# Patient Record
Sex: Female | Born: 1935 | Race: White | Hispanic: No | Marital: Married | Smoking: Never smoker
Health system: Southern US, Community
[De-identification: ages and names within clinical notes are randomized; demographics above are authoritative.]

## PROBLEM LIST (undated history)

## (undated) DIAGNOSIS — R42 Dizziness and giddiness: Secondary | ICD-10-CM

## (undated) DIAGNOSIS — I10 Essential (primary) hypertension: Secondary | ICD-10-CM

## (undated) DIAGNOSIS — E079 Disorder of thyroid, unspecified: Secondary | ICD-10-CM

## (undated) HISTORY — PX: APPENDECTOMY: SHX54

## (undated) HISTORY — PX: HERNIA REPAIR: SHX51

## (undated) HISTORY — PX: ABDOMINAL HYSTERECTOMY: SHX81

---

## 1999-05-15 ENCOUNTER — Encounter: Admission: RE | Admit: 1999-05-15 | Discharge: 1999-05-15 | Payer: Self-pay | Admitting: Rheumatology

## 1999-05-15 ENCOUNTER — Encounter: Payer: Self-pay | Admitting: Rheumatology

## 2000-05-20 ENCOUNTER — Encounter: Admission: RE | Admit: 2000-05-20 | Discharge: 2000-05-20 | Payer: Self-pay | Admitting: Rheumatology

## 2000-05-20 ENCOUNTER — Encounter: Payer: Self-pay | Admitting: Rheumatology

## 2000-05-28 ENCOUNTER — Other Ambulatory Visit: Admission: RE | Admit: 2000-05-28 | Discharge: 2000-05-28 | Payer: Self-pay | Admitting: Rheumatology

## 2000-11-25 ENCOUNTER — Encounter: Admission: RE | Admit: 2000-11-25 | Discharge: 2000-11-25 | Payer: Self-pay | Admitting: Rheumatology

## 2000-11-25 ENCOUNTER — Encounter: Payer: Self-pay | Admitting: Rheumatology

## 2001-06-10 ENCOUNTER — Encounter: Admission: RE | Admit: 2001-06-10 | Discharge: 2001-06-10 | Payer: Self-pay | Admitting: Rheumatology

## 2001-06-10 ENCOUNTER — Encounter: Payer: Self-pay | Admitting: Rheumatology

## 2002-06-14 ENCOUNTER — Encounter: Admission: RE | Admit: 2002-06-14 | Discharge: 2002-06-14 | Payer: Self-pay | Admitting: Rheumatology

## 2002-06-14 ENCOUNTER — Encounter: Payer: Self-pay | Admitting: Rheumatology

## 2003-04-17 ENCOUNTER — Encounter: Admission: RE | Admit: 2003-04-17 | Discharge: 2003-04-17 | Payer: Self-pay | Admitting: Orthopedic Surgery

## 2003-05-03 ENCOUNTER — Encounter: Admission: RE | Admit: 2003-05-03 | Discharge: 2003-05-03 | Payer: Self-pay | Admitting: Orthopedic Surgery

## 2003-05-05 ENCOUNTER — Ambulatory Visit (HOSPITAL_BASED_OUTPATIENT_CLINIC_OR_DEPARTMENT_OTHER): Admission: RE | Admit: 2003-05-05 | Discharge: 2003-05-05 | Payer: Self-pay | Admitting: Orthopedic Surgery

## 2003-05-05 ENCOUNTER — Ambulatory Visit (HOSPITAL_COMMUNITY): Admission: RE | Admit: 2003-05-05 | Discharge: 2003-05-05 | Payer: Self-pay | Admitting: Orthopedic Surgery

## 2004-02-13 ENCOUNTER — Encounter: Admission: RE | Admit: 2004-02-13 | Discharge: 2004-02-13 | Payer: Self-pay | Admitting: Rheumatology

## 2005-02-25 ENCOUNTER — Encounter: Admission: RE | Admit: 2005-02-25 | Discharge: 2005-02-25 | Payer: Self-pay | Admitting: Rheumatology

## 2006-03-11 ENCOUNTER — Encounter: Admission: RE | Admit: 2006-03-11 | Discharge: 2006-03-11 | Payer: Self-pay | Admitting: Rheumatology

## 2007-03-25 ENCOUNTER — Encounter: Admission: RE | Admit: 2007-03-25 | Discharge: 2007-03-25 | Payer: Self-pay | Admitting: Rheumatology

## 2008-03-28 ENCOUNTER — Encounter: Admission: RE | Admit: 2008-03-28 | Discharge: 2008-03-28 | Payer: Self-pay | Admitting: Rheumatology

## 2009-03-29 ENCOUNTER — Encounter: Admission: RE | Admit: 2009-03-29 | Discharge: 2009-03-29 | Payer: Self-pay | Admitting: Internal Medicine

## 2009-04-28 ENCOUNTER — Encounter: Payer: Self-pay | Admitting: Emergency Medicine

## 2009-04-28 ENCOUNTER — Ambulatory Visit: Payer: Self-pay | Admitting: Diagnostic Radiology

## 2009-04-28 ENCOUNTER — Inpatient Hospital Stay (HOSPITAL_COMMUNITY): Admission: EM | Admit: 2009-04-28 | Discharge: 2009-04-29 | Payer: Self-pay | Admitting: Internal Medicine

## 2010-05-12 LAB — HEMOGLOBIN A1C
Hgb A1c MFr Bld: 5.6 % (ref 4.6–6.1)
Mean Plasma Glucose: 114 mg/dL

## 2010-05-12 LAB — TSH: TSH: 1.166 u[IU]/mL (ref 0.350–4.500)

## 2010-05-12 LAB — BASIC METABOLIC PANEL
BUN: 16 mg/dL (ref 6–23)
Calcium: 8.2 mg/dL — ABNORMAL LOW (ref 8.4–10.5)
GFR calc non Af Amer: 38 mL/min — ABNORMAL LOW (ref >60)
Glucose, Bld: 98 mg/dL (ref 70–99)
Sodium: 140 mEq/L (ref 135–145)

## 2010-05-13 LAB — DIFFERENTIAL
Basophils Relative: 1 % (ref 0–1)
Eosinophils Relative: 3 % (ref 0–5)

## 2010-05-13 LAB — CBC
HCT: 41 % (ref 36.0–46.0)
Hemoglobin: 13.9 g/dL (ref 12.0–15.0)
MCV: 95.6 fL (ref 78.0–100.0)
RDW: 11.9 % (ref 11.5–15.5)

## 2010-05-13 LAB — BASIC METABOLIC PANEL
Calcium: 9.1 mg/dL (ref 8.4–10.5)
GFR calc Af Amer: >60 mL/min (ref >60)
GFR calc non Af Amer: 52 mL/min — ABNORMAL LOW (ref >60)
Sodium: 144 mEq/L (ref 135–145)

## 2010-05-13 LAB — GASTRIC OCCULT BLOOD (1-CARD TO LAB): Occult Blood, Gastric: POSITIVE — AB

## 2010-07-05 NOTE — Op Note (Signed)
[name], Candace Perkins                          ACCOUNT NO.:  [id number]   MEDICAL RECORD NO.:  [id number]                   PATIENT TYPE:  AMB   LOCATION:  DSC                                  FACILITY:  [facility]   PHYSICIAN:  Thera Flake., M.D.             DATE OF BIRTH:  29-Jul-1920   DATE OF PROCEDURE:  05/05/2003  DATE OF DISCHARGE:                                 OPERATIVE REPORT   PREOPERATIVE DIAGNOSIS:  1. Rotator cuff tear, right shoulder.  2. Partial biceps tendon tear.  3. Degenerative tear anterior and superior labrum.  4. Impingement acromioclavicular joint arthritis.   POSTOPERATIVE DIAGNOSIS:  1. Rotator cuff tear, right shoulder.  2. Partial biceps tendon tear.  3. Degenerative tear anterior and superior labrum.  4. Impingement acromioclavicular joint arthritis.   OPERATION PERFORMED:  1. Arthroscopic debridement torn labrum, biceps tendon.  2. Open acromioplasty, distal clavicle excision.  3. Open rotator cuff repair.   SURGEON:  Dyke Brackett, M.D.   ASSISTANT:  Madilyn Fireman, P.A.-C.   ANESTHESIA:  General with block.   INDICATIONS FOR PROCEDURE:  The patient is an 77 year old with MRI proven  cuff tear with __________  felt to be amenable to overnight hospitalization  repair.   DESCRIPTION OF PROCEDURE:  She was arthroscoped in beach chair position  after general anesthesia and a block was performed by Leonette Most E. Gelene Mink,  M.D.  Immediately obvious was a very large tear with significant retraction.  The patient is a relatively active 77 year old.  It is her dominant arm.  It  was thought that attempted repair was indicated.  It was a very large tear.  There was degenerative tearing in the anterior and superior labrum and  atrophic tearing and partial tear of the biceps requiring debridement.  This  was followed by conversion of the procedure to an open procedure with  freshening of the edges.  This created a rather large defect in the shoulder  relative to atrophic attenuated cuff.  A portion of the anterior posterior  aspects of the cuff were converted more to a transverse tear with oversewing  of the longitudinal component with #2 FiberWire.  Prior to this a bone being  burred with a high speed bur on the tuberosity as well as excision of  extremely hypertrophied arthritic acromioclavicular joint as well as a very  prominent type 3 acromion.  Again, once the longitudinal component was  fixed, one Arthrex anchor was placed to anchor the repair in the transverse  component and a good repair was obtained although the attachment could not  be placed back into the anatomic attachment due to tissue loss.  The wound  was copiously  irrigated and closed with layers, particularly with  #1 Tycron on the  deltoid, 2-0 Vicryl in the subcutaneous tissues and the skin with Monocryl.  Marcaine with epinephrine infiltrated in the skin.  Lightly compressive  sterile  dressing applied.                                               Thera Flake., M.D.    WDC/MEDQ  D:  05/05/2003  T:  05/08/2003  Job:  [id number]

## 2012-05-13 ENCOUNTER — Encounter (HOSPITAL_COMMUNITY): Payer: Self-pay | Admitting: Emergency Medicine

## 2012-05-13 ENCOUNTER — Emergency Department (HOSPITAL_COMMUNITY): Payer: Medicare Other

## 2012-05-13 ENCOUNTER — Inpatient Hospital Stay (HOSPITAL_COMMUNITY)
Admission: EM | Admit: 2012-05-13 | Discharge: 2012-05-15 | DRG: 065 | Disposition: A | Discharge status: Home Health | Payer: Medicare Other | Attending: Internal Medicine | Admitting: Internal Medicine

## 2012-05-13 DIAGNOSIS — R29810 Facial weakness: Secondary | ICD-10-CM | POA: Diagnosis present

## 2012-05-13 DIAGNOSIS — I10 Essential (primary) hypertension: Secondary | ICD-10-CM | POA: Diagnosis present

## 2012-05-13 DIAGNOSIS — I639 Cerebral infarction, unspecified: Secondary | ICD-10-CM | POA: Diagnosis present

## 2012-05-13 DIAGNOSIS — R471 Dysarthria and anarthria: Secondary | ICD-10-CM | POA: Diagnosis present

## 2012-05-13 DIAGNOSIS — I6330 Cerebral infarction due to thrombosis of unspecified cerebral artery: Secondary | ICD-10-CM

## 2012-05-13 DIAGNOSIS — I6350 Cerebral infarction due to unspecified occlusion or stenosis of unspecified cerebral artery: Secondary | ICD-10-CM | POA: Diagnosis present

## 2012-05-13 DIAGNOSIS — R4789 Other speech disturbances: Secondary | ICD-10-CM | POA: Diagnosis present

## 2012-05-13 DIAGNOSIS — R062 Wheezing: Secondary | ICD-10-CM | POA: Diagnosis not present

## 2012-05-13 DIAGNOSIS — E039 Hypothyroidism, unspecified: Secondary | ICD-10-CM | POA: Diagnosis present

## 2012-05-13 DIAGNOSIS — G8190 Hemiplegia, unspecified affecting unspecified side: Secondary | ICD-10-CM | POA: Diagnosis present

## 2012-05-13 HISTORY — DX: Essential (primary) hypertension: I10

## 2012-05-13 HISTORY — DX: Disorder of thyroid, unspecified: E07.9

## 2012-05-13 HISTORY — DX: Dizziness and giddiness: R42

## 2012-05-13 LAB — CBC WITH DIFFERENTIAL/PLATELET
Basophils Absolute: 0 10*3/uL (ref 0.0–0.1)
Basophils Relative: 0 % (ref 0–1)
Eosinophils Absolute: 0.1 10*3/uL (ref 0.0–0.7)
Eosinophils Relative: 1 % (ref 0–5)
HCT: 40.8 % (ref 36.0–46.0)
Hemoglobin: 14.7 g/dL (ref 12.0–15.0)
Lymphocytes Relative: 14 % (ref 12–46)
Monocytes Absolute: 0.6 10*3/uL (ref 0.1–1.0)
Neutrophils Relative %: 79 % — ABNORMAL HIGH (ref 43–77)
Platelets: 182 10*3/uL (ref 150–400)
RBC: 4.52 MIL/uL (ref 3.87–5.11)

## 2012-05-13 LAB — URINALYSIS, ROUTINE W REFLEX MICROSCOPIC
Ketones, ur: NEGATIVE mg/dL
pH: 7.5 (ref 5.0–8.0)

## 2012-05-13 LAB — BASIC METABOLIC PANEL
BUN: 13 mg/dL (ref 6–23)
Chloride: 101 mEq/L (ref 96–112)
Creatinine, Ser: 0.92 mg/dL (ref 0.50–1.10)
GFR calc Af Amer: 62 mL/min — ABNORMAL LOW (ref >90)
Sodium: 142 mEq/L (ref 135–145)

## 2012-05-13 MED ORDER — SODIUM CHLORIDE 0.9 % IV BOLUS (SEPSIS)
1000.0000 mL | Freq: Once | INTRAVENOUS | Status: AC
  Administered 2012-05-13: 1000 mL via INTRAVENOUS

## 2012-05-13 MED ORDER — ONDANSETRON HCL 4 MG/2ML IJ SOLN: 4.0000 mg | Freq: Three times a day (TID) | INTRAMUSCULAR | Status: DC | PRN

## 2012-05-13 MED ORDER — ENOXAPARIN SODIUM 40 MG/0.4ML ~~LOC~~ SOLN
40.0000 mg | SUBCUTANEOUS | Status: DC
  Administered 2012-05-13 – 2012-05-14 (×2): 40 mg via SUBCUTANEOUS
  Filled 2012-05-13 (×3): qty 0.4

## 2012-05-13 MED ORDER — ASPIRIN EC 81 MG PO TBEC
81.0000 mg | DELAYED_RELEASE_TABLET | Freq: Every day | ORAL | Status: DC
  Filled 2012-05-13 (×2): qty 1

## 2012-05-13 MED ORDER — ACETAMINOPHEN 500 MG PO TABS
1000.0000 mg | ORAL_TABLET | Freq: Four times a day (QID) | ORAL | Status: DC | PRN
  Filled 2012-05-13: qty 2

## 2012-05-13 MED ORDER — LEVOTHYROXINE SODIUM 50 MCG PO TABS
50.0000 ug | ORAL_TABLET | Freq: Every day | ORAL | Status: DC
  Administered 2012-05-14 – 2012-05-15 (×2): 50 ug via ORAL
  Filled 2012-05-13 (×4): qty 1

## 2012-05-13 MED ORDER — METOPROLOL SUCCINATE ER 50 MG PO TB24
50.0000 mg | ORAL_TABLET | Freq: Every day | ORAL | Status: DC
  Administered 2012-05-14 – 2012-05-15 (×2): 50 mg via ORAL
  Filled 2012-05-13 (×3): qty 1

## 2012-05-13 MED ORDER — ONDANSETRON HCL 4 MG/2ML IJ SOLN
4.0000 mg | Freq: Once | INTRAMUSCULAR | Status: AC
  Administered 2012-05-13: 4 mg via INTRAVENOUS
  Filled 2012-05-13: qty 2

## 2012-05-13 MED ORDER — SIMVASTATIN 20 MG PO TABS
20.0000 mg | ORAL_TABLET | Freq: Every day | ORAL | Status: DC
  Administered 2012-05-14: 20 mg via ORAL
  Filled 2012-05-13 (×2): qty 1

## 2012-05-13 MED ORDER — ONDANSETRON HCL 4 MG/2ML IJ SOLN: 4.0000 mg | Freq: Four times a day (QID) | INTRAMUSCULAR | Status: DC | PRN

## 2012-05-13 MED ORDER — HYPROMELLOSE (GONIOSCOPIC) 2.5 % OP SOLN
1.0000 [drp] | Freq: Three times a day (TID) | OPHTHALMIC | Status: DC | PRN
  Filled 2012-05-13: qty 15

## 2012-05-13 MED ORDER — ADULT MULTIVITAMIN W/MINERALS CH
1.0000 | ORAL_TABLET | Freq: Every day | ORAL | Status: DC
  Administered 2012-05-14 – 2012-05-15 (×2): 1 via ORAL
  Filled 2012-05-13 (×2): qty 1

## 2012-05-13 NOTE — ED Notes (Signed)
Pt returned from vascular lab. Placed back on monitor.

## 2012-05-13 NOTE — ED Notes (Signed)
Pt here with c/o hypertension and "i feel lifeless". Reports she felt lightheaded this morning around 0930. Pt reports nausea with no vomiting. Family member in room reports pt being "unsteady" with gait.

## 2012-05-13 NOTE — Progress Notes (Signed)
  Echocardiogram 2D Echocardiogram has been performed.  Candace Perkins 05/13/2012, 5:42 PM

## 2012-05-13 NOTE — ED Provider Notes (Signed)
History     CSN: [id number]  Arrival date & time 05/13/12  1003   First MD Initiated Contact with Patient 05/13/12 1027      Chief Complaint  Patient presents with  . Hypertension    (Consider location/radiation/quality/duration/timing/severity/associated sxs/prior treatment) HPI Comments: Pt is a 79 y/o female with chief complaint of feeling light headed. She states that she was standing and getting dressed this morning when she began feeling light headed and weak, but did not pass out or fall. She took her blood pressure at that point and it was 170's systolic, which is higher than usual (normally 130's-140's). She also endorses feeling off balance at that time.She has not had trouble speaking, extremity weakness, numbness or facial droop. Pt has a history of vertigo, but says that this current episode does not feel like the same dizziness, and that this has never happened before. The light headedness has been constant, but is worse when sitting or standing from a lying position. She endorses nausea, but denies vomiting, diarrhea, abdominal pain, vaginal bleeding, cough, shortness of breath, chest pain.  No hx of strokes.  Symptoms started at 9 pm.  Patient is a 79 y.o. female presenting with hypertension. The history is provided by the patient.  Hypertension Pertinent negatives include no chest pain, no abdominal pain, no headaches and no shortness of breath.    Past Medical History  Diagnosis Date  . Hypertension   . Thyroid disease   . Vertigo     No past surgical history on file.  No family history on file.  History  Substance Use Topics  . Smoking status: Not on file  . Smokeless tobacco: Not on file  . Alcohol Use: Not on file    OB History   Grav Para Term Preterm Abortions TAB SAB Ect Mult Living                  Review of Systems  Constitutional: Positive for fatigue. Negative for activity change.  HENT: Negative for facial swelling and neck pain.    Respiratory: Negative for cough, shortness of breath and wheezing.   Cardiovascular: Negative for chest pain.  Gastrointestinal: Positive for nausea. Negative for vomiting, abdominal pain, diarrhea, constipation, blood in stool and abdominal distention.  Genitourinary: Negative for hematuria and difficulty urinating.  Skin: Negative for color change and rash.  Neurological: Positive for light-headedness. Negative for syncope, facial asymmetry, speech difficulty and headaches.  Hematological: Does not bruise/bleed easily.  Psychiatric/Behavioral: Negative for confusion.    Allergies  Demerol and Penicillins  Home Medications   Current Outpatient Rx  Name  Route  Sig  Dispense  Refill  . acetaminophen (TYLENOL) 500 MG tablet   Oral   Take 1,000 mg by mouth every 6 (six) hours as needed for pain.         . hydrochlorothiazide (MICROZIDE) 12.5 MG capsule   Oral   Take 12.5 mg by mouth daily.         . hydroxypropyl methylcellulose (ISOPTO TEARS) 2.5 % ophthalmic solution   Both Eyes   Place 1 drop into both eyes 3 (three) times daily as needed (for dry eyes).         Marland Kitchen levothyroxine (SYNTHROID, LEVOTHROID) 50 MCG tablet   Oral   Take 50 mcg by mouth daily.         . meclizine (ANTIVERT) 12.5 MG tablet   Oral   Take 12.5 mg by mouth 3 (three) times daily as needed  for dizziness.         . metoprolol succinate (TOPROL-XL) 50 MG 24 hr tablet   Oral   Take 50 mg by mouth daily. Take with or immediately following a meal.         . Multiple Vitamin (MULTIVITAMIN WITH MINERALS) TABS   Oral   Take 1 tablet by mouth daily.         . pravastatin (PRAVACHOL) 40 MG tablet   Oral   Take 40 mg by mouth daily.           BP 170/76  Pulse 89  Temp(Src) 97.4 F (36.3 C) (Oral)  SpO2 93%  Physical Exam  Constitutional: She is oriented to person, place, and time. She appears well-developed.  HENT:  Head: Normocephalic and atraumatic.  Eyes: Conjunctivae and EOM  are normal. Pupils are equal, round, and reactive to light.  Neck: Normal range of motion. Neck supple.  Cardiovascular: Normal rate and regular rhythm.   Murmur heard. Pulmonary/Chest: Effort normal and breath sounds normal. No respiratory distress.  Abdominal: Soft. Bowel sounds are normal. She exhibits no distension. There is no tenderness. There is no rebound and no guarding.  Neurological: She is alert and oriented to person, place, and time. No cranial nerve deficit. Coordination normal.  Cerebellar exam is normal (finger to nose and heel to shin) Sensory exam normal for bilateral upper and lower extremities - and patient is able to discriminate between sharp and dull. Motor exam is 4+/5  No nystagmus seen, but patient states that her dizziness gets worse when she sits up  Skin: Skin is warm and dry.    ED Course  Procedures (including critical care time)  Labs Reviewed  CBC WITH DIFFERENTIAL  BASIC METABOLIC PANEL  URINALYSIS, ROUTINE W REFLEX MICROSCOPIC   Dg Chest Port 1 View  05/13/2012  *RADIOLOGY REPORT*  Clinical Data: Hypertension.  PORTABLE CHEST - 1 VIEW  Comparison: PA and lateral chest 02/28/2010.  Findings: Heart size is upper normal.  Lungs clear.  No pneumothorax or pleural effusion.  IMPRESSION: No acute disease.   Original Report Authenticated By: Holley Dexter, M.D.      No diagnosis found.    MDM   Date: 05/13/2012  Rate: 73  Rhythm: normal sinus rhythm  QRS Axis: normal  Intervals: normal  ST/T Wave abnormalities: normal  Conduction Disutrbances: none  Narrative Interpretation: unremarkable  Pt comes in with cc of dizziness. Onset at 9 am. Neuro exam non focal, orthostatic negative. Pt has no bruits.  We had concerns for possible cerebellar stroke - so Neuro was consulted. 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 Repeat exam is unchanged Pt to be admitted.  Derwood Kaplan,  MD 05/13/12 1710

## 2012-05-13 NOTE — ED Notes (Signed)
Pt currently not on the floor and still in MRI at this time. Report received from [place], [place]

## 2012-05-13 NOTE — Progress Notes (Signed)
*  PRELIMINARY RESULTS* Vascular Ultrasound Carotid Duplex (Doppler) has been completed.  Preliminary findings: Bilateral:  No evidence of hemodynamically significant internal carotid artery stenosis.   Vertebral artery flow is antegrade.       Candace Perkins 05/13/2012, 5:42 PM

## 2012-05-13 NOTE — Consult Note (Addendum)
NEURO HOSPITALIST CONSULT NOTE    Reason for Consult:dizziness.  HPI:                                                                                                                                          Candace Perkins is an 79 y.o. female with a past medical history significant for hypertension, thyroid disease, episodic vertigo, brought to [facility] with sudden onset dizziness. She tells me that she has a history of chronic " spinning vertigo, with the last attack couple of years ago", but this morning around 9:30 am she developed abrupt onset of " a different type of dizziness" that has been present ever since. She said that it is not a fainting sensation or lightheadedness and the dizziness is not provoked by head movements. Stated that she feels nauseated but denies vomiting, tinnitus, headache, double vision, difficulty swallowing, dysequilibrium, focal weakness or numbness, slurred speech, language or vision impairment. Denies recent fever, infection, or head trauma. No new medications or changes in medications Blood pressure 160 mm HG in the ED. CT brain without contrast showed no acute intracranial abnormality.   Past Medical History  Diagnosis Date  . Hypertension   . Thyroid disease   . Vertigo     No past surgical history on file.  No family history on file.   Social History:  has no tobacco, alcohol, and drug history on file.  Allergies  Allergen Reactions  . Demerol (Meperidine)   . Penicillins     MEDICATIONS:                                                                                                                     I have reviewed the patient's current medications.   ROS:  History obtained from the patient and chart review.  General ROS: negative for - chills, fatigue, fever, night sweats, weight gain or  weight loss Psychological ROS: negative for - behavioral disorder, hallucinations, memory difficulties, mood swings or suicidal ideation Ophthalmic ROS: negative for - blurry vision, double vision, eye pain or loss of vision ENT ROS: negative for - epistaxis, nasal discharge, oral lesions, sore throat, tinnitus or vertigo Allergy and Immunology ROS: negative for - hives or itchy/watery eyes Hematological and Lymphatic ROS: negative for - bleeding problems, bruising or swollen lymph nodes Endocrine ROS: negative for - galactorrhea, hair pattern changes, polydipsia/polyuria or temperature intolerance Respiratory ROS: negative for - cough, hemoptysis, shortness of breath or wheezing Cardiovascular ROS: negative for - chest pain, dyspnea on exertion, edema or irregular heartbeat Gastrointestinal ROS: negative for - abdominal pain, diarrhea, hematemesis, nausea/vomiting or stool incontinence Genito-Urinary ROS: negative for - dysuria, hematuria, incontinence or urinary frequency/urgency Musculoskeletal ROS: negative for - joint swelling or muscular weakness Neurological ROS: as noted in HPI Dermatological ROS: negative for rash and skin lesion changes     Physical exam: pleasant female in no apparent distress. Blood pressure 170/76, pulse 89, temperature 97.4 F (36.3 C), temperature source Oral, SpO2 93.00%. Head: normocephalic. Neck: supple, no bruits, no JVD. Cardiac: no murmurs. Lungs: clear. Abdomen: soft, no tender, no mass. Extremities: no edema.   Neurologic Examination:                                                                                                      Mental Status: Alert, awake, oriented x 4, thought content appropriate. Comprehension, naming, and repetition intact. Speech fluent without evidence of aphasia.  Able to follow 3 step commands without difficulty. Cranial Nerves: II: Discs flat bilaterally; Visual fields grossly normal, pupils equal, round, reactive  to light and accommodation III,IV, VI: ptosis not present, extra-ocular motions intact bilaterally V,VII: smile symmetric, facial light touch sensation normal bilaterally VIII: hearing normal bilaterally IX,X: gag reflex present XI: bilateral shoulder shrug XII: midline tongue extension Motor: Right : Upper extremity   5/5    Left:     Upper extremity   5/5  Lower extremity   5/5     Lower extremity   5/5 Tone and bulk:normal tone throughout; no atrophy noted Sensory: Pinprick and light touch intact throughout, bilaterally Deep Tendon Reflexes: 2+ and symmetric throughout Plantars: Right: downgoing   Left: downgoing Cerebellar: normal finger-to-nose,  normal heel-to-shin test Gait: no ataxia. CV: pulses palpable throughout    No results found for this basename: cbc, bmp, coags, chol, tri, ldl, hga1c    Results for orders placed during the hospital encounter of 05/13/12 (from the past 48 hour(s))  CBC WITH DIFFERENTIAL     Status: Abnormal   Collection Time    05/13/12 11:06 AM      Result Value Range   WBC 9.9  4.0 - 10.5 K/uL   RBC 4.52  3.87 - 5.11 MIL/uL   Hemoglobin 14.7  12.0 - 15.0 g/dL   HCT 16.1  09.6 - 04.5 %  MCV 90.3  78.0 - 100.0 fL   MCH 32.5  26.0 - 34.0 pg   MCHC 36.0  30.0 - 36.0 g/dL   RDW 57.8  46.9 - 62.9 %   Platelets 182  150 - 400 K/uL   Neutrophils Relative 79 (*) 43 - 77 %   Neutro Abs 7.8 (*) 1.7 - 7.7 K/uL   Lymphocytes Relative 14  12 - 46 %   Lymphs Abs 1.4  0.7 - 4.0 K/uL   Monocytes Relative 6  3 - 12 %   Monocytes Absolute 0.6  0.1 - 1.0 K/uL   Eosinophils Relative 1  0 - 5 %   Eosinophils Absolute 0.1  0.0 - 0.7 K/uL   Basophils Relative 0  0 - 1 %   Basophils Absolute 0.0  0.0 - 0.1 K/uL  BASIC METABOLIC PANEL     Status: Abnormal   Collection Time    05/13/12 11:06 AM      Result Value Range   Sodium 142  135 - 145 mEq/L   Potassium 3.6  3.5 - 5.1 mEq/L   Chloride 101  96 - 112 mEq/L   CO2 30  19 - 32 mEq/L   Glucose, Bld 119  (*) 70 - 99 mg/dL   BUN 13  6 - 23 mg/dL   Creatinine, Ser 5.28  0.50 - 1.10 mg/dL   Calcium 9.9  8.4 - 41.3 mg/dL   GFR calc non Af Amer 53 (*) >90 mL/min   GFR calc Af Amer 62 (*) >90 mL/min   Comment:            The eGFR has been calculated     using the CKD EPI equation.     This calculation has not been     validated in all clinical     situations.     eGFR's persistently     <90 mL/min signify     possible Chronic Kidney Disease.    Ct Head Wo Contrast  05/13/2012  *RADIOLOGY REPORT*  Clinical Data: Dizziness, nausea and vomiting.  CT HEAD WITHOUT CONTRAST  Technique:  Contiguous axial images were obtained from the base of the skull through the vertex without contrast.  Comparison: Head CT scan 04/28/2009.  Findings: There is no evidence of acute intracranial abnormality including infarct, hemorrhage, mass lesion, mass effect or abnormal extra-axial fluid collection.  Mild atrophy and chronic microvascular ischemic change are noted.  The calvarium is intact.  IMPRESSION: No acute abnormality.   Original Report Authenticated By: Holley Dexter, M.D.    Dg Chest Port 1 View  05/13/2012  *RADIOLOGY REPORT*  Clinical Data: Hypertension.  PORTABLE CHEST - 1 VIEW  Comparison: PA and lateral chest 02/28/2010.  Findings: Heart size is upper normal.  Lungs clear.  No pneumothorax or pleural effusion.  IMPRESSION: No acute disease.   Original Report Authenticated By: Holley Dexter, M.D.      Assessment/Plan: 79 years old female with hypertension, thyroid disease, episodic vertigo, brought to the ED with acute onset dizziness, nausea, and elevated BP. She describes the dizziness as different to her episodic vertigo. Neuro-exam is unimpressive and CT brain unremarkable. Will suggest MRI brain for further clarification of her symptoms  Wyatt Portela, MD [facility] [phone]  05/13/2012, 12:59 PM   Addendum: MRI-DWI showed acute infarct in the deep white matter on the  right and there may also be some involvement of the posterior limb internal capsule and lateral thalamus on the right. MRA  brain unimpressive. Start aspirin 81 mg daily. Complete stroke work up. Stroke team to resume care in the morning.  Wyatt Portela, MD

## 2012-05-13 NOTE — H&P (Signed)
[facility] History and Physical  Candace Perkins UXL:[id number] DOB: 05-02-1920 DOA: 05/13/2012  Referring physician: ED PCP: Lillia Mountain, MD  Specialists: neurology  Chief Complaint:  Chief Complaint  Patient presents with  . Hypertension  dizzy    HPI: Candace Perkins is a 79 y.o. female with hx of HTn who had sudden onset of dizziness today. She also noted some nausea, but did not vomit. No headaches. No vertigo. No focal weakness . She was seen by neurology but she did not have any major neurological deficits and did not receive t PA. MRI was positive for a small deep white matter stroke and patient is getting admitted to the hospital.     Review of Systems: The patient denies anorexia, fever, weight loss,, vision loss, decreased hearing, hoarseness, chest pain, syncope, dyspnea on exertion, peripheral edema, balance deficits, hemoptysis, abdominal pain, melena, hematochezia, severe indigestion/heartburn, hematuria, incontinence, genital sores, muscle weakness, suspicious skin lesions, transient blindness, difficulty walking, depression, unusual weight change, abnormal bleeding, enlarged lymph nodes, angioedema, and breast masses.    Past Medical History  Diagnosis Date  . Hypertension   . Thyroid disease   . Vertigo    No past surgical history on file. Social History:  reports that she has never smoked. She does not have any smokeless tobacco history on file. She reports that she does not drink alcohol or use illicit drugs. Lives with husband and is independent   Allergies  Allergen Reactions  . Demerol (Meperidine)   . Penicillins     Fhx  Negative for strokes   Prior to Admission medications   Medication Sig Start Date End Date Taking? Authorizing Provider  acetaminophen (TYLENOL) 500 MG tablet Take 1,000 mg by mouth every 6 (six) hours as needed for pain.   Yes Historical Provider, MD  hydrochlorothiazide (MICROZIDE) 12.5 MG capsule Take 12.5 mg by  mouth daily.   Yes Historical Provider, MD  hydroxypropyl methylcellulose (ISOPTO TEARS) 2.5 % ophthalmic solution Place 1 drop into both eyes 3 (three) times daily as needed (for dry eyes).   Yes Historical Provider, MD  levothyroxine (SYNTHROID, LEVOTHROID) 50 MCG tablet Take 50 mcg by mouth daily.   Yes Historical Provider, MD  meclizine (ANTIVERT) 12.5 MG tablet Take 12.5 mg by mouth 3 (three) times daily as needed for dizziness.   Yes Historical Provider, MD  metoprolol succinate (TOPROL-XL) 50 MG 24 hr tablet Take 50 mg by mouth daily. Take with or immediately following a meal.   Yes Historical Provider, MD  Multiple Vitamin (MULTIVITAMIN WITH MINERALS) TABS Take 1 tablet by mouth daily.   Yes Historical Provider, MD  pravastatin (PRAVACHOL) 40 MG tablet Take 40 mg by mouth daily.   Yes Historical Provider, MD   Physical Exam: Filed Vitals:   05/13/12 1020 05/13/12 1203 05/13/12 1204  BP: 168/75 156/67 170/76  Pulse: 79 80 89  Temp: 97.4 F (36.3 C)    TempSrc: Oral    SpO2: 93%     Patient Vitals for the past 24 hrs:  BP Temp Temp src Pulse SpO2  05/13/12 1204 170/76 mmHg - - 89 -  05/13/12 1203 156/67 mmHg - - 80 -  05/13/12 1020 168/75 mmHg 97.4 F (36.3 C) Oral 79 93 %     General:  axox3  Eyes: perrla , eomi   ENT: clear pharynx   Neck: No JVD  Cardiovascular: Regular rate and rhythm without murmurs rubs or gallops  Respiratory: Clear to auscultation bilateral  Abdomen:  Soft nontender bowel sounds present  Skin: No suspicious rashes  Musculoskeletal: Intact muscle bulk and tone  Psychiatric: Euthymic  Neurologic: Dysarthric, finger to nose abnormal on the left otherwise completely intact  Labs on Admission:  Basic Metabolic Panel:  Recent Labs Lab 05/13/12 1106  NA 142  K 3.6  CL 101  CO2 30  GLUCOSE 119*  BUN 13  CREATININE 0.92  CALCIUM 9.9   Liver Function Tests: No results found for this basename: AST, ALT, ALKPHOS, BILITOT, PROT,  ALBUMIN,  in the last 168 hours No results found for this basename: LIPASE, AMYLASE,  in the last 168 hours No results found for this basename: AMMONIA,  in the last 168 hours CBC:  Recent Labs Lab 05/13/12 1106  WBC 9.9  NEUTROABS 7.8*  HGB 14.7  HCT 40.8  MCV 90.3  PLT 182   Cardiac Enzymes: No results found for this basename: CKTOTAL, CKMB, CKMBINDEX, TROPONINI,  in the last 168 hours  BNP (last 3 results) No results found for this basename: PROBNP,  in the last 8760 hours CBG: No results found for this basename: GLUCAP,  in the last 168 hours  Radiological Exams on Admission: Ct Head Wo Contrast  05/13/2012  *RADIOLOGY REPORT*  Clinical Data: Dizziness, nausea and vomiting.  CT HEAD WITHOUT CONTRAST  Technique:  Contiguous axial images were obtained from the base of the skull through the vertex without contrast.  Comparison: Head CT scan 04/28/2009.  Findings: There is no evidence of acute intracranial abnormality including infarct, hemorrhage, mass lesion, mass effect or abnormal extra-axial fluid collection.  Mild atrophy and chronic microvascular ischemic change are noted.  The calvarium is intact.  IMPRESSION: No acute abnormality.   Original Report Authenticated By: Holley Dexter, M.D.    Mr [facility] Wo Contrast  05/13/2012  *RADIOLOGY REPORT*  Clinical Data:  Stroke.  Dizziness.  MRI HEAD WITHOUT CONTRAST MRA HEAD WITHOUT CONTRAST  Technique:  Multiplanar, multiecho pulse sequences of the brain and surrounding structures were obtained without intravenous contrast. Angiographic images of the head were obtained using MRA technique without contrast.  Comparison:  CT 05/13/2012  MRI HEAD  Findings:  Acute infarct in the deep white matter on the right. There may also be some involvement of the posterior limb internal capsule and lateral thalamus on the right.  No other acute infarct.  Generalized atrophy.  Chronic microvascular ischemic changes in the white matter.  Small chronic  infarcts in the cerebellum bilaterally.  Mild chronic microvascular ischemia in the pons.  Negative for intracranial hemorrhage.  Prominent calcification in the basal ganglia bilaterally, felt to be physiologic.  Ventricle size is normal.  Negative for mass lesion.  No midline shift.  IMPRESSION: Acute infarct in the deep white matter on the right.  Mild chronic microvascular ischemic changes in the white matter and pons.  MRA HEAD  Findings:  Both vertebral arteries are widely patent to the basilar.  PICA is patent bilaterally.  Basilar is widely patent. Superior cerebellar and posterior cerebral arteries are patent bilaterally.  Fetal origin of the right posterior cerebral artery with hypoplastic right P1 segment.  Cavernous carotid is widely patent bilaterally.  Anterior and middle cerebral arteries are patent bilaterally. Mild stenosis right middle cerebral artery bifurcation.  Mild irregularity in the left M1 segment.  Both anterior cerebral arteries are widely patent.  No aneurysm is detected.  IMPRESSION: Mild atherosclerotic irregularity in the middle cerebral artery bilaterally.  No large vessel occlusion or critical stenosis.  Original Report Authenticated By: Janeece Riggers, M.D.    Mr Brain Wo Contrast  05/13/2012  *RADIOLOGY REPORT*  Clinical Data:  Stroke.  Dizziness.  MRI HEAD WITHOUT CONTRAST MRA HEAD WITHOUT CONTRAST  Technique:  Multiplanar, multiecho pulse sequences of the brain and surrounding structures were obtained without intravenous contrast. Angiographic images of the head were obtained using MRA technique without contrast.  Comparison:  CT 05/13/2012  MRI HEAD  Findings:  Acute infarct in the deep white matter on the right. There may also be some involvement of the posterior limb internal capsule and lateral thalamus on the right.  No other acute infarct.  Generalized atrophy.  Chronic microvascular ischemic changes in the white matter.  Small chronic infarcts in the cerebellum  bilaterally.  Mild chronic microvascular ischemia in the pons.  Negative for intracranial hemorrhage.  Prominent calcification in the basal ganglia bilaterally, felt to be physiologic.  Ventricle size is normal.  Negative for mass lesion.  No midline shift.  IMPRESSION: Acute infarct in the deep white matter on the right.  Mild chronic microvascular ischemic changes in the white matter and pons.  MRA HEAD  Findings:  Both vertebral arteries are widely patent to the basilar.  PICA is patent bilaterally.  Basilar is widely patent. Superior cerebellar and posterior cerebral arteries are patent bilaterally.  Fetal origin of the right posterior cerebral artery with hypoplastic right P1 segment.  Cavernous carotid is widely patent bilaterally.  Anterior and middle cerebral arteries are patent bilaterally. Mild stenosis right middle cerebral artery bifurcation.  Mild irregularity in the left M1 segment.  Both anterior cerebral arteries are widely patent.  No aneurysm is detected.  IMPRESSION: Mild atherosclerotic irregularity in the middle cerebral artery bilaterally.  No large vessel occlusion or critical stenosis.   Original Report Authenticated By: Janeece Riggers, M.D.    Dg Chest Port 1 View  05/13/2012  *RADIOLOGY REPORT*  Clinical Data: Hypertension.  PORTABLE CHEST - 1 VIEW  Comparison: PA and lateral chest 02/28/2010.  Findings: Heart size is upper normal.  Lungs clear.  No pneumothorax or pleural effusion.  IMPRESSION: No acute disease.   Original Report Authenticated By: Holley Dexter, M.D.       Assessment/Plan Principal Problem:   Stroke Active Problems:   Hypertension   Hypothyroidism   1. Acute deep white matter stroke-most likely etiology related to microangiopathy versus amyloid angiopathy. Plan to start aspirin, admit for frequent neurological checks, telemetry monitoring, complete stroke workup with hemoglobin A1c lipid panel carotid Dopplers and echocardiogram. Patient will be followed by  Dr. Kirby Funk and the stroke M.D. Tomorrow 2. Hypothyroidism-we'll continue the Synthroid without changes   Code Status: full code  Family Communication: husband  Disposition Plan: home   Dania Marsan [facility] Pager [phone]  If 7PM-7AM, please contact night-coverage [email or web address] Password [facility] 05/13/2012, 3:39 PM

## 2012-05-13 NOTE — ED Notes (Addendum)
Pt hx of vertigo but pt reports she does not feel this is like her vertigo. Pt poor historian.

## 2012-05-14 ENCOUNTER — Encounter (HOSPITAL_COMMUNITY): Payer: Self-pay | Admitting: *Deleted

## 2012-05-14 DIAGNOSIS — I6330 Cerebral infarction due to thrombosis of unspecified cerebral artery: Principal | ICD-10-CM

## 2012-05-14 LAB — LIPID PANEL
Cholesterol: 166 mg/dL (ref 0–200)
HDL: 44 mg/dL (ref >39)
Triglycerides: 126 mg/dL (ref <150)
VLDL: 25 mg/dL (ref 0–40)

## 2012-05-14 MED ORDER — SODIUM CHLORIDE 0.9 % IV SOLN
INTRAVENOUS | Status: DC
  Administered 2012-05-14 (×2): via INTRAVENOUS

## 2012-05-14 MED ORDER — ASPIRIN 300 MG RE SUPP
300.0000 mg | Freq: Every day | RECTAL | Status: DC
  Filled 2012-05-14: qty 1

## 2012-05-14 MED ORDER — ASPIRIN 325 MG PO TABS
325.0000 mg | ORAL_TABLET | Freq: Every day | ORAL | Status: DC
  Administered 2012-05-14 – 2012-05-15 (×2): 325 mg via ORAL
  Filled 2012-05-14 (×2): qty 1

## 2012-05-14 NOTE — Evaluation (Signed)
Speech Language Pathology Evaluation Patient Details Name: LAVELLE BERLAND MRN: [id number] DOB: 16-Dec-1920 Today's Date: 05/14/2012 Time: 0820-0900 SLP Time Calculation (min): 40 min  Problem List:  Patient Active Problem List  Diagnosis  . Stroke  . Hypertension  . Hypothyroidism   Past Medical History:  Past Medical History  Diagnosis Date  . Hypertension   . Thyroid disease   . Vertigo    Past Surgical History:  Past Surgical History  Procedure Laterality Date  . Abdominal hysterectomy    . Appendectomy    . Hernia repair     HPI:  Rogene S Hataway is a 79 y.o. female with hx of HTN who had sudden onset of dizziness. She also noted some nausea, but did not vomit. No headaches. No vertigo. No focal weakness . She was seen by neurology but she did not have any major neurological deficits and did not receive t PA. MRI was positive for a small deep white matter stroke.   Assessment / Plan / Recommendation Clinical Impression  Pt presents with a mild dysarthria due to left labial lingual weakness and decreased sensation though pt still mostly intelligible. Language and comprehension WNL. Pt did struggle with higher level verbal problem solving and reasoning. Feel pt likely cognitively independent with basic tasks and safety, though she would at least need supervision assist with high level tasks such as managing medications, finances, cooking etc. Recommend supervision vs. rehab depending on physical needs. SLP will continue to follow for cognition and dysphagia.     SLP Assessment  Patient needs continued Speech Lanaguage Pathology Services    Follow Up Recommendations       Frequency and Duration min 2x/week  2 weeks   Pertinent Vitals/Pain NA   SLP Goals  SLP Goals SLP Goal #1: Pt will complete complex functional problem solving task with supervision assist.  SLP Goal #2: Pt will utilize speech intelligiblity strategies at conversation level for 5 minutes with moderate  verbal cues.  SLP Goal #3: Pt will complete complex verbal reasoning task with supervision assist.   SLP Evaluation Prior Functioning  Cognitive/Linguistic Baseline: Within functional limits Type of Home: House Lives With: Alone Available Help at Discharge: Available PRN/intermittently;Family Vocation: Retired   IT consultant  Overall Cognitive Status: Impaired Arousal/Alertness: Awake/alert Orientation Level: Oriented X4 Attention: Alternating Alternating Attention: Appears intact Memory: Appears intact Awareness: Appears intact Problem Solving: Impaired Problem Solving Impairment: Verbal complex Executive Function: Reasoning Reasoning: Impaired Reasoning Impairment: Verbal complex Safety/Judgment: Appears intact    Comprehension  Auditory Comprehension Overall Auditory Comprehension: Appears within functional limits for tasks assessed    Expression Verbal Expression Overall Verbal Expression: Appears within functional limits for tasks assessed   Oral / Motor Oral Motor/Sensory Function Overall Oral Motor/Sensory Function: Impaired Labial ROM: Within Functional Limits Labial Symmetry: Abnormal symmetry left Labial Strength: Reduced Labial Sensation: Reduced Lingual ROM: Within Functional Limits Lingual Symmetry: Within Functional Limits Lingual Strength: Reduced Lingual Sensation: Reduced Facial ROM: Within Functional Limits Facial Symmetry: Left droop Facial Strength: Reduced Facial Sensation: Reduced Velum: Within Functional Limits Mandible: Within Functional Limits Motor Speech Overall Motor Speech: Impaired Respiration: Within functional limits Phonation: Normal Resonance: Within functional limits Articulation: Impaired Level of Impairment: Conversation Intelligibility: Intelligibility reduced Word: 75-100% accurate Phrase: 75-100% accurate Sentence: 75-100% accurate Conversation: 75-100% accurate   GO     Dynisha Due, Riley Nearing 05/14/2012, 9:13  AM

## 2012-05-14 NOTE — Progress Notes (Signed)
Stroke Team Progress Note  HISTORY Candace Perkins is an 79 y.o. female with a past medical history significant for hypertension, thyroid disease, episodic vertigo, brought to [facility] with sudden onset dizziness. She tells me that she has a history of chronic " spinning vertigo, with the last attack couple of years ago", but this morning around 9:30 am she developed abrupt onset of " a different type of dizziness" that has been present ever since. She said that it is not a fainting sensation or lightheadedness and the dizziness is not provoked by head movements. Stated that she feels nauseated but denies vomiting, tinnitus, headache, double vision, difficulty swallowing, dysequilibrium, focal weakness or numbness, slurred speech, language or vision impairment. Denies recent fever, infection, or head trauma. No new medications or changes in medications. Blood pressure 160 mm HG in the ED. CT brain without contrast showed no acute intracranial abnormality. Patient was not a TPA candidate secondary to unrecognized stroke, non-focal exam. She was admitted for further evaluation and treatment.  SUBJECTIVE Her family is at the bedside.  Overall she feels her condition is gradually improving. She is ready to get up and walk.  OBJECTIVE Most recent Vital Signs: Filed Vitals:   05/14/12 0001 05/14/12 0200 05/14/12 0400 05/14/12 0601  BP: 144/63 142/59 135/56 135/56  Pulse: 72 73 74 77  Temp: 97.1 F (36.2 C) 97.3 F (36.3 C) 97.7 F (36.5 C) 97.7 F (36.5 C)  TempSrc: Oral Oral Oral Oral  Resp: 18 20 18 16   Height:    5\' 4"  (1.626 m)  Weight:    60.374 kg (133 lb 1.6 oz)  SpO2: 96% 94% 96% 97%   CBG (last 3)  No results found for this basename: GLUCAP,  in the last 72 hours  IV Fluid Intake:   . sodium chloride 75 mL/hr at 05/14/12 1110    MEDICATIONS  . aspirin  300 mg Rectal Daily  . enoxaparin (LOVENOX) injection  40 mg Subcutaneous Q24H  . levothyroxine  50 mcg Oral QAC breakfast  .  metoprolol succinate  50 mg Oral QAC breakfast  . multivitamin with minerals  1 tablet Oral Daily  . simvastatin  20 mg Oral q1800   PRN:  acetaminophen, hydroxypropyl methylcellulose, ondansetron (ZOFRAN) IV  Diet:  Dysphagia 3 nectar thick liquids Activity:   Up with assistance DVT Prophylaxis:  Lovenox 40 mg sq daily   CLINICALLY SIGNIFICANT STUDIES Basic Metabolic Panel:  Recent Labs Lab 05/13/12 1106  NA 142  K 3.6  CL 101  CO2 30  GLUCOSE 119*  BUN 13  CREATININE 0.92  CALCIUM 9.9   Liver Function Tests: No results found for this basename: AST, ALT, ALKPHOS, BILITOT, PROT, ALBUMIN,  in the last 168 hours CBC:  Recent Labs Lab 05/13/12 1106  WBC 9.9  NEUTROABS 7.8*  HGB 14.7  HCT 40.8  MCV 90.3  PLT 182   Coagulation: No results found for this basename: LABPROT, INR,  in the last 168 hours Cardiac Enzymes: No results found for this basename: CKTOTAL, CKMB, CKMBINDEX, TROPONINI,  in the last 168 hours Urinalysis:  Recent Labs Lab 05/13/12 1521  COLORURINE YELLOW  LABSPEC 1.014  PHURINE 7.5  GLUCOSEU NEGATIVE  HGBUR NEGATIVE  BILIRUBINUR NEGATIVE  KETONESUR NEGATIVE  PROTEINUR NEGATIVE  UROBILINOGEN 0.2  NITRITE NEGATIVE  LEUKOCYTESUR NEGATIVE   Lipid Panel    Component Value Date/Time   CHOL 166 05/14/2012 0512   TRIG 126 05/14/2012 0512   HDL 44 05/14/2012 0512  CHOLHDL 3.8 05/14/2012 0512   VLDL 25 05/14/2012 0512   LDLCALC 97 05/14/2012 0512   HgbA1C  Lab Results  Component Value Date   HGBA1C  Value: 5.6 (NOTE) The ADA recommends the following therapeutic goal for glycemic control related to Hgb A1c measurement: Goal of therapy: <6.5 Hgb A1c  Reference: American Diabetes Association: Clinical Practice Recommendations 2010, Diabetes Care, 2010, 33: (Suppl  1). 04/28/2009    Urine Drug Screen:   No results found for this basename: labopia, cocainscrnur, labbenz, amphetmu, thcu, labbarb    Alcohol Level: No results found for this basename: ETH,   in the last 168 hours  CT of the brain  05/13/2012   No acute abnormality.    MRI of the brain  05/13/2012   Acute infarct in the deep white matter on the right.  Mild chronic microvascular ischemic changes in the white matter and pons.     MRA of the brain  05/13/2012  Mild atherosclerotic irregularity in the middle cerebral artery bilaterally.  No large vessel occlusion or critical stenosis.      2D Echocardiogram  EF 60-65% with no source of embolus.   Carotid Doppler  No evidence of hemodynamically significant internal carotid artery stenosis. Vertebral artery flow is antegrade.   CXR  05/13/2012   No acute disease.   EKG  normal sinus rhythm.   Therapy Recommendations   Physical Exam   Elderly Caucasian lady not in distress.Awake alert. Afebrile. Head is nontraumatic. Neck is supple without bruit. Hearing is normal. Cardiac exam no murmur or gallop. Lungs are clear to auscultation. Distal pulses are well felt.  Neurological Exam : Awake alert oriented x 2 normal speech and language.Diminished attention and recall. Mild left lower face asymmetry. Tongue midline. No drift. Mild diminished fine finger movements on left. Orbits right over left upper extremity. Mild left grip weak.. Normal sensation . Normal coordination.  ASSESSMENT [name] is a 79 y.o. female presenting with abrupt onset of dizziness. Imaging confirms a right deep white matter infarct. Infarct felt to be thrombotic secondary to small vessel disease.  On no antiplatelets prior to admission. Now on aspirin suppository for secondary stroke prevention. Patient with resultant facial weakness, slurred speech, left hemiparesis, neglect vs dementia not recognized by family. Work up completed.  Hypertension Hx Vertigo  Hospital day # 1  TREATMENT/PLAN  Change suppository to  aspirin 325 mg orally every day for secondary stroke prevention.  OOB. Therapy evals with recommendations for discharge follow up No further  stroke workup indicated. Patient has a 10-15% risk of having another stroke over the next year, the highest risk is within 2 weeks of the most recent stroke/TIA (risk of having a stroke following a stroke or TIA is the same). Ongoing risk factor control by Primary Care Physician Stroke Service will sign off. Please call should any needs arise. Follow up with Dr. Pearlean Brownie, Stroke Clinic, in 2 months.  Annie Main, MSN, RN, ANVP-BC, ANP-BC, [facility] Pager: [phone] 05/14/2012 11:16 AM  I have personally obtained a history, examined the patient, evaluated imaging results, and formulated the assessment and plan of care. I agree with the above.  Delia Heady

## 2012-05-14 NOTE — Evaluation (Signed)
Physical Therapy Evaluation Patient Details Name: Candace Perkins MRN: [id number] DOB: 1920/05/03 Today's Date: 05/14/2012 Time: 4098-1191 PT Time Calculation (min): 37 min  PT Assessment / Plan / Recommendation Clinical Impression  pt rpesents with R Deep White Matter Infarct involving the posterior limb of internal capsule and lateral thalamus.  pt moving well and anticipate good progress to return to baseline.  pt will need OPPT for high level balance activities.      PT Assessment  Patient needs continued PT services    Follow Up Recommendations  Outpatient PT    Does the patient have the potential to tolerate intense rehabilitation      Barriers to Discharge None      Equipment Recommendations  None recommended by PT    Recommendations for Other Services     Frequency Min 4X/week    Precautions / Restrictions Precautions Precautions: Fall Restrictions Weight Bearing Restrictions: No   Pertinent Vitals/Pain Denies pain.        Mobility  Bed Mobility Bed Mobility: Not assessed (pt up in chair) Transfers Transfers: Sit to Stand;Stand to Sit Sit to Stand: 4: Min guard;From chair/3-in-1;With armrests;With upper extremity assist Stand to Sit: 4: Min guard;To chair/3-in-1;With armrests;With upper extremity assist Details for Transfer Assistance: Min guard for safety. Pt slightly unsteady upon initially standing but able to correct and regain balance without physical assist. Ambulation/Gait Ambulation/Gait Assistance: 4: Min guard Ambulation Distance (Feet): 300 Feet Assistive device: None Ambulation/Gait Assistance Details: pt with decreased attention on L side and at times needs cueing to attend to objects and room numbers on L.  pt mildly unsteady during cognitive tasks and when needing to attend to L side.   Gait Pattern: Step-through pattern;Decreased stride length Stairs: No Wheelchair Mobility Wheelchair Mobility: No Modified Rankin (Stroke Patients  Only) Pre-Morbid Rankin Score: No symptoms Modified Rankin: Moderate disability    Exercises     PT Diagnosis: Difficulty walking  PT Problem List: Decreased strength;Decreased activity tolerance;Decreased balance;Decreased mobility PT Treatment Interventions: Gait training;Functional mobility training;Therapeutic activities;Therapeutic exercise;Balance training;Neuromuscular re-education;Patient/family education   PT Goals Acute Rehab PT Goals PT Goal Formulation: With patient Time For Goal Achievement: 05/21/12 Potential to Achieve Goals: Good Pt will go Supine/Side to Sit: with modified independence PT Goal: Supine/Side to Sit - Progress: Goal set today Pt will go Sit to Supine/Side: with modified independence PT Goal: Sit to Supine/Side - Progress: Goal set today Pt will go Sit to Stand: with modified independence PT Goal: Sit to Stand - Progress: Goal set today Pt will go Stand to Sit: with modified independence PT Goal: Stand to Sit - Progress: Goal set today Pt will Ambulate: >150 feet;with modified independence PT Goal: Ambulate - Progress: Goal set today  Visit Information  Last PT Received On: 05/14/12 Assistance Needed: +1 PT/OT Co-Evaluation/Treatment: Yes    Subjective Data  Subjective: I know I ran into a couple things out there.   Patient Stated Goal: Home   Prior Functioning  Home Living Lives With: Alone Available Help at Discharge: Available PRN/intermittently;Family (Family lives across the street and checks daily.  ) Type of Home: House Home Access: Level entry Home Layout: One level Bathroom Shower/Tub: Health visitor: Handicapped height Home Adaptive Equipment: Built-in shower seat Prior Function Level of Independence: Independent Able to Take Stairs?: Yes Driving: Yes Vocation: Retired Musician: Expressive difficulties    Copywriter, advertising Overall Cognitive Status: Appears within functional limits for  tasks assessed/performed Arousal/Alertness: Awake/alert Orientation Level: Appears intact for  tasks assessed Behavior During Session: [facility] for tasks performed    Extremity/Trunk Assessment Right Upper Extremity Assessment RUE ROM/Strength/Tone: [facility] for tasks assessed (3+/5 - 4/5) RUE Sensation: WFL - Light Touch;WFL - Proprioception RUE Coordination: WFL - gross/fine motor Left Upper Extremity Assessment LUE ROM/Strength/Tone: WFL for tasks assessed (3+/5 - 4/5) LUE Sensation: Deficits LUE Sensation Deficits: Decreased proprioception. LUE Coordination: Deficits LUE Coordination Deficits: incr time and effort, decreased accuracy with GMC and FMC. Right Lower Extremity Assessment RLE ROM/Strength/Tone: WFL for tasks assessed RLE Sensation: WFL - Light Touch Left Lower Extremity Assessment LLE ROM/Strength/Tone: Deficits LLE ROM/Strength/Tone Deficits: Mildly weak, DF 4/5, knee/hip 4+/5.   LLE Sensation: WFL - Light Touch Trunk Assessment Trunk Assessment: Normal   Balance Balance Balance Assessed: Yes Static Standing Balance Static Standing - Balance Support: No upper extremity supported;During functional activity Static Standing - Level of Assistance: 5: Stand by assistance Static Standing - Comment/# of Minutes: stand by assist while talking with therapist in hall. High Level Balance High Level Balance Activites: Direction changes;Turns;Sudden stops;Head turns  End of Session PT - End of Session Equipment Utilized During Treatment: Gait belt Activity Tolerance: Patient tolerated treatment well Patient left: in chair;with call bell/phone within reach;with family/visitor present Nurse Communication: Mobility status  GP     Sunny Schlein, [place] [phone] 05/14/2012, 2:37 PM

## 2012-05-14 NOTE — Evaluation (Signed)
Clinical/Bedside Swallow Evaluation Patient Details  Name: Candace Perkins MRN: [id number] Date of Birth: 01/31/1921  Today's Date: 05/14/2012 Time: 0820-0900 SLP Time Calculation (min): 40 min  Past Medical History:  Past Medical History  Diagnosis Date  . Hypertension   . Thyroid disease   . Vertigo    Past Surgical History:  Past Surgical History  Procedure Laterality Date  . Abdominal hysterectomy    . Appendectomy    . Hernia repair     HPI:  Candace Perkins is a 79 y.o. female with hx of HTN who had sudden onset of dizziness. She also noted some nausea, but did not vomit. No headaches. No vertigo. No focal weakness . She was seen by neurology but she did not have any major neurological deficits and did not receive t PA. MRI was positive for a small deep white matter stroke.   Assessment / Plan / Recommendation Clinical Impression  Pt demonstrates immediate evidence of aspiration with thin liquids, due to suspected delay in swallow initaiton. Pt does endorse choking episodes prior to admit. Likely pt with some age related dysphagia worsened by new sensory-motor deficits from CVA. Pt tolerates nectar thick liquids without evidence of aspiration and demonstrates ability to masticate soft solids with occasional pt effort needed to clear left buccal cavity. SLP will f/u for tolerance and possible upgrade. Pt understands precautions.     Aspiration Risk  Moderate    Diet Recommendation Dysphagia 3 (Mechanical Soft);Nectar-thick liquid   Liquid Administration via: Cup Medication Administration: Whole meds with puree Supervision: Patient able to self feed Compensations: Slow rate;Small sips/bites;Check for pocketing Postural Changes and/or Swallow Maneuvers: Seated upright 90 degrees;Upright 30-60 min after meal    Other  Recommendations     Follow Up Recommendations       Frequency and Duration min 2x/week  2 weeks   Pertinent Vitals/Pain NA    SLP Swallow Goals Patient  will utilize recommended strategies during swallow to increase swallowing safety with: Minimal cueing   Swallow Study Prior Functional Status  Cognitive/Linguistic Baseline: Within functional limits Type of Home: House Lives With: Alone Available Help at Discharge: Available PRN/intermittently;Family Vocation: Retired    Radio producer HPI: Candace Perkins is a 79 y.o. female with hx of HTN who had sudden onset of dizziness. She also noted some nausea, but did not vomit. No headaches. No vertigo. No focal weakness . She was seen by neurology but she did not have any major neurological deficits and did not receive t PA. MRI was positive for a small deep white matter stroke. Type of Study: Bedside swallow evaluation Diet Prior to this Study: NPO Temperature Spikes Noted: No Respiratory Status: Room air History of Recent Intubation: No Behavior/Cognition: Alert;Cooperative;Pleasant mood Oral Cavity - Dentition: Adequate natural dentition Self-Feeding Abilities: Able to feed self Patient Positioning: Upright in bed Baseline Vocal Quality: Clear Volitional Cough: Strong Volitional Swallow: Able to elicit    Oral/Motor/Sensory Function Overall Oral Motor/Sensory Function: Impaired Labial ROM: Within Functional Limits Labial Symmetry: Abnormal symmetry left Labial Strength: Reduced Labial Sensation: Reduced Lingual ROM: Within Functional Limits Lingual Symmetry: Within Functional Limits Lingual Strength: Reduced Lingual Sensation: Reduced Facial ROM: Within Functional Limits Facial Symmetry: Left droop Facial Strength: Reduced Facial Sensation: Reduced Velum: Within Functional Limits Mandible: Within Functional Limits   Ice Chips     Thin Liquid Thin Liquid: Impaired Presentation: Cup;Self Fed Oral Phase Impairments: Reduced labial seal Pharyngeal  Phase Impairments: Suspected delayed Swallow;Cough - Immediate    Nectar  Thick Nectar Thick Liquid: Impaired Presentation: Cup;Self  Fed Pharyngeal Phase Impairments: Suspected delayed Swallow   Honey Thick Honey Thick Liquid: Not tested   Puree Puree: Impaired Presentation: Self Fed Oral Phase Impairments: Reduced labial seal Oral Phase Functional Implications: Left lateral sulci pocketing;Prolonged oral transit;Oral residue   Solid   GO    Solid: Impaired Presentation: Self Fed Oral Phase Impairments: Reduced labial seal;Reduced lingual movement/coordination Oral Phase Functional Implications: Left lateral sulci pocketing;Oral residue      Harlon Ditty, MA CCC-SLP [phone]  Claudine Mouton 05/14/2012,9:22 AM

## 2012-05-14 NOTE — Progress Notes (Signed)
Subjective: L face and L hand a little weak, dizziness better  Objective: Vital signs in last 24 hours: Temp:  [97.1 F (36.2 C)-97.9 F (36.6 C)] 97.7 F (36.5 C) (03/28 0601) Pulse Rate:  [61-96] 77 (03/28 0601) Resp:  [16-20] 16 (03/28 0601) BP: (133-170)/(53-76) 135/56 mmHg (03/28 0601) SpO2:  [93 %-97 %] 97 % (03/28 0601) Weight:  [60.374 kg (133 lb 1.6 oz)] 60.374 kg (133 lb 1.6 oz) (03/28 0601) Weight change:     Intake/Output from previous day:   Intake/Output this shift:    General appearance: alert and cooperative Resp: clear to auscultation bilaterally Cardio: regularly irregular rhythm GI: soft, non-tender; bowel sounds normal; no masses,  no organomegaly Extremities: extremities normal, atraumatic, no cyanosis or edema Neurologic: Mental status: Alert, oriented, thought content appropriate Cranial nerves: L lower facial weakness Motor: L handgrip 4+5  Lab Results:  Recent Labs  05/13/12 1106  WBC 9.9  HGB 14.7  HCT 40.8  PLT 182   BMET  Recent Labs  05/13/12 1106  NA 142  K 3.6  CL 101  CO2 30  GLUCOSE 119*  BUN 13  CREATININE 0.92  CALCIUM 9.9    Studies/Results: Ct Head Wo Contrast  05/13/2012  *RADIOLOGY REPORT*  Clinical Data: Dizziness, nausea and vomiting.  CT HEAD WITHOUT CONTRAST  Technique:  Contiguous axial images were obtained from the base of the skull through the vertex without contrast.  Comparison: Head CT scan 04/28/2009.  Findings: There is no evidence of acute intracranial abnormality including infarct, hemorrhage, mass lesion, mass effect or abnormal extra-axial fluid collection.  Mild atrophy and chronic microvascular ischemic change are noted.  The calvarium is intact.  IMPRESSION: No acute abnormality.   Original Report Authenticated By: Holley Dexter, M.D.    Mr [facility] Wo Contrast  05/13/2012  *RADIOLOGY REPORT*  Clinical Data:  Stroke.  Dizziness.  MRI HEAD WITHOUT CONTRAST MRA HEAD WITHOUT CONTRAST  Technique:   Multiplanar, multiecho pulse sequences of the brain and surrounding structures were obtained without intravenous contrast. Angiographic images of the head were obtained using MRA technique without contrast.  Comparison:  CT 05/13/2012  MRI HEAD  Findings:  Acute infarct in the deep white matter on the right. There may also be some involvement of the posterior limb internal capsule and lateral thalamus on the right.  No other acute infarct.  Generalized atrophy.  Chronic microvascular ischemic changes in the white matter.  Small chronic infarcts in the cerebellum bilaterally.  Mild chronic microvascular ischemia in the pons.  Negative for intracranial hemorrhage.  Prominent calcification in the basal ganglia bilaterally, felt to be physiologic.  Ventricle size is normal.  Negative for mass lesion.  No midline shift.  IMPRESSION: Acute infarct in the deep white matter on the right.  Mild chronic microvascular ischemic changes in the white matter and pons.  MRA HEAD  Findings:  Both vertebral arteries are widely patent to the basilar.  PICA is patent bilaterally.  Basilar is widely patent. Superior cerebellar and posterior cerebral arteries are patent bilaterally.  Fetal origin of the right posterior cerebral artery with hypoplastic right P1 segment.  Cavernous carotid is widely patent bilaterally.  Anterior and middle cerebral arteries are patent bilaterally. Mild stenosis right middle cerebral artery bifurcation.  Mild irregularity in the left M1 segment.  Both anterior cerebral arteries are widely patent.  No aneurysm is detected.  IMPRESSION: Mild atherosclerotic irregularity in the middle cerebral artery bilaterally.  No large vessel occlusion or critical stenosis.  Original Report Authenticated By: Janeece Riggers, M.D.    Mr Brain Wo Contrast  05/13/2012  *RADIOLOGY REPORT*  Clinical Data:  Stroke.  Dizziness.  MRI HEAD WITHOUT CONTRAST MRA HEAD WITHOUT CONTRAST  Technique:  Multiplanar, multiecho pulse  sequences of the brain and surrounding structures were obtained without intravenous contrast. Angiographic images of the head were obtained using MRA technique without contrast.  Comparison:  CT 05/13/2012  MRI HEAD  Findings:  Acute infarct in the deep white matter on the right. There may also be some involvement of the posterior limb internal capsule and lateral thalamus on the right.  No other acute infarct.  Generalized atrophy.  Chronic microvascular ischemic changes in the white matter.  Small chronic infarcts in the cerebellum bilaterally.  Mild chronic microvascular ischemia in the pons.  Negative for intracranial hemorrhage.  Prominent calcification in the basal ganglia bilaterally, felt to be physiologic.  Ventricle size is normal.  Negative for mass lesion.  No midline shift.  IMPRESSION: Acute infarct in the deep white matter on the right.  Mild chronic microvascular ischemic changes in the white matter and pons.  MRA HEAD  Findings:  Both vertebral arteries are widely patent to the basilar.  PICA is patent bilaterally.  Basilar is widely patent. Superior cerebellar and posterior cerebral arteries are patent bilaterally.  Fetal origin of the right posterior cerebral artery with hypoplastic right P1 segment.  Cavernous carotid is widely patent bilaterally.  Anterior and middle cerebral arteries are patent bilaterally. Mild stenosis right middle cerebral artery bifurcation.  Mild irregularity in the left M1 segment.  Both anterior cerebral arteries are widely patent.  No aneurysm is detected.  IMPRESSION: Mild atherosclerotic irregularity in the middle cerebral artery bilaterally.  No large vessel occlusion or critical stenosis.   Original Report Authenticated By: Janeece Riggers, M.D.    Dg Chest Port 1 View  05/13/2012  *RADIOLOGY REPORT*  Clinical Data: Hypertension.  PORTABLE CHEST - 1 VIEW  Comparison: PA and lateral chest 02/28/2010.  Findings: Heart size is upper normal.  Lungs clear.  No  pneumothorax or pleural effusion.  IMPRESSION: No acute disease.   Original Report Authenticated By: Holley Dexter, M.D.     Medications: I have reviewed the patient's current medications.  Assessment/Plan: Principal Problem:   Stroke R deep white matter small vessel type CVA, some facial droop and L hand weakness, swallow eval, PT, OT today, carotid U/S neg, echo pending, asa suppository for now.  IV NS, NPO for now Active Problems:   Hypertension control acceptable, on Beta blocker, hctz held   Hypothyroidism   Disposition determine after PT/OT/ST evaluation, ?need rehab   LOS: 1 day   Jonni Oelkers JOSEPH 05/14/2012, 7:51 AM

## 2012-05-14 NOTE — Evaluation (Signed)
Occupational Therapy Evaluation Patient Details Name: Candace Perkins MRN: [id number] DOB: [birth year] Today's Date: 05/14/2012 Time: 9629-5284 OT Time Calculation (min): 37 min  OT Assessment / Plan / Recommendation Clinical Impression  Pt admitted with dizziness. MRI findings show acute infarct in deep white matter on right.  Stroke work up underway. Pt will benefit from continued OT services in prep for return home. Recommending OPOT to progress rehab.    OT Assessment  Patient needs continued OT Services    Follow Up Recommendations  Outpatient OT    Barriers to Discharge None    Equipment Recommendations       Recommendations for Other Services    Frequency  Min 3X/week    Precautions / Restrictions Precautions Precautions: Fall Restrictions Weight Bearing Restrictions: No   Pertinent Vitals/Pain See vitals    ADL  Eating/Feeding: Performed;Supervision/safety Where Assessed - Eating/Feeding: Chair Grooming: Performed;Wash/dry face;Supervision/safety Where Assessed - Grooming: Unsupported standing Upper Body Bathing: Simulated;Set up Where Assessed - Upper Body Bathing: Unsupported sitting Lower Body Bathing: Simulated;Min guard Where Assessed - Lower Body Bathing: Supported sit to stand Upper Body Dressing: Performed;Set up Where Assessed - Upper Body Dressing: Unsupported sitting Lower Body Dressing: Simulated;Min guard Where Assessed - Lower Body Dressing: Supported sit to stand Toilet Transfer: Simulated;Min Pension scheme manager Method: Sit to Barista:  (chair) Equipment Used: Gait belt Transfers/Ambulation Related to ADLs: Min guard during ambulation. Pt tends to veer to left and occasionally bumps into objects on left side.  No physical assist needed to correct balance, pt able to correct with verbal cues. ADL Comments: Performed mapping tasks and high level cognitive tasks while ambulating in hall which pt responded appropriately and  correctly but requires increased time and occasional verbal cueing to scan to left side. Educated both pt and pt's family on need for increased (preferably 24/7) supervision for safety due to pt's balance deficits and decreased awareness of left side.  Daughter verbalized understanding and reports she plans to stay with pt for a while.     OT Diagnosis: Generalized weakness;Paresis  OT Problem List: Decreased strength;Decreased activity tolerance;Impaired balance (sitting and/or standing);Decreased safety awareness;Impaired UE functional use OT Treatment Interventions: Self-care/ADL training;Therapeutic exercise;Neuromuscular education;Therapeutic activities;Patient/family education;Balance training   OT Goals Acute Rehab OT Goals OT Goal Formulation: With patient/family Time For Goal Achievement: 05/28/12 Potential to Achieve Goals: Good Miscellaneous OT Goals Miscellaneous OT Goal #1: Pt will retrieve 5/5 ADL items throughout room at mod I level with no LOB. OT Goal: Miscellaneous Goal #1 - Progress: Goal set today Miscellaneous OT Goal #2: Pt will independently perform theraputty HEP to increase LUE FMC.  OT Goal: Miscellaneous Goal #2 - Progress: Goal set today Miscellaneous OT Goal #3: Pt will reach for 5 ADL items using LUE to increase GMC in prep for functional UE use during ADLs. OT Goal: Miscellaneous Goal #3 - Progress: Goal set today Miscellaneous OT Goal #4: Pt will attend to left side 75% of time with 1 verbal cue. OT Goal: Miscellaneous Goal #4 - Progress: Goal set today  Visit Information  Last OT Received On: 05/14/12 Assistance Needed: +1 PT/OT Co-Evaluation/Treatment: Yes    Subjective Data  Subjective: "Am I going to have to go to Rehab?" Patient Stated Goal: to return home   Prior Functioning     Home Living Lives With: Alone Available Help at Discharge: Available PRN/intermittently;Family (Family lives across the street and checks daily.  ) Type of Home:  House Home Access: Level entry Home  Layout: One level Bathroom Shower/Tub: Health visitor: Handicapped height Home Adaptive Equipment: Built-in shower seat Prior Function Level of Independence: Independent Able to Take Stairs?: Yes Driving: Yes Vocation: Retired Musician: Expressive difficulties         Vision/Perception Vision - History Baseline Vision: Wears glasses only for reading Patient Visual Report: No change from baseline Vision - Assessment Eye Alignment: Within Functional Limits Vision Assessment: Vision tested Ocular Range of Motion: Within Functional Limits Tracking/Visual Pursuits: Able to track stimulus in all quads without difficulty Visual Fields: No apparent deficits Additional Comments: Pt's vision testing WFL.  However functionally, pt does bump into objects when ambulating and tends to require cueing to turn head fully to left to scan environment.   Cognition  Cognition Overall Cognitive Status: Appears within functional limits for tasks assessed/performed Arousal/Alertness: Awake/alert Orientation Level: Appears intact for tasks assessed Behavior During Session: [facility] for tasks performed    Extremity/Trunk Assessment Right Upper Extremity Assessment RUE ROM/Strength/Tone: [facility] for tasks assessed (3+/5 - 4/5) RUE Sensation: WFL - Light Touch;WFL - Proprioception RUE Coordination: WFL - gross/fine motor Left Upper Extremity Assessment LUE ROM/Strength/Tone: WFL for tasks assessed (3+/5 - 4/5) LUE Sensation: Deficits LUE Sensation Deficits: Decreased proprioception. LUE Coordination: Deficits LUE Coordination Deficits: incr time and effort, decreased accuracy with GMC and FMC.     Mobility Bed Mobility Bed Mobility: Not assessed (pt up in chair) Transfers Transfers: Stand to Sit;Sit to Stand Sit to Stand: 4: Min guard;From chair/3-in-1;With armrests;With upper extremity assist Stand to Sit: 4: Min guard;To  chair/3-in-1;With armrests;With upper extremity assist Details for Transfer Assistance: Min guard for safety. Pt slightly unsteady upon initially standing but able to correct and regain balance without physical assist.     Exercise     Balance Balance Balance Assessed: Yes Static Standing Balance Static Standing - Balance Support: No upper extremity supported;During functional activity Static Standing - Level of Assistance: 5: Stand by assistance Static Standing - Comment/# of Minutes: stand by assist while talking with therapist in hall.   End of Session OT - End of Session Equipment Utilized During Treatment: Gait belt Activity Tolerance: Patient tolerated treatment well Patient left: in chair;with call bell/phone within reach;with family/visitor present Nurse Communication: Mobility status  GO    05/14/2012 Cipriano Mile OTR/L Pager [phone] Office [phone]   Cipriano Mile 05/14/2012, 1:25 PM

## 2012-05-14 NOTE — Progress Notes (Signed)
Patient speech seems to have progressively become more slurred this evening. Slightly slurred at beginning of shift, and this AM seems to be slightly harder to understand. Left facial droop was also very slight at beginning of shift, and is more noticeable at present time. Very slight ataxia of LUE also has been noted throughout shift.

## 2012-05-14 NOTE — Progress Notes (Signed)
Pt in 4n20 Candace Perkins was in A-fib around 0722, this am, HR was 83, no one to alert am rn, [name], now she is  1st degree AV BLOCK, PA on call M. Burnadette Peter was made aware. Will pass it off to on-coming rn and will continue to monitor pt---Jevaun Strick, rn

## 2012-05-14 NOTE — Progress Notes (Signed)
Utilization review completed.  P.J. Arlina Sabina,RN,BSN Case Manager [phone]  

## 2012-05-15 DIAGNOSIS — R062 Wheezing: Secondary | ICD-10-CM | POA: Diagnosis not present

## 2012-05-15 LAB — BASIC METABOLIC PANEL
BUN: 13 mg/dL (ref 6–23)
CO2: 28 mEq/L (ref 19–32)
Chloride: 108 mEq/L (ref 96–112)
Creatinine, Ser: 0.88 mg/dL (ref 0.50–1.10)
GFR calc Af Amer: 65 mL/min — ABNORMAL LOW (ref >90)

## 2012-05-15 MED ORDER — ALBUTEROL SULFATE HFA 108 (90 BASE) MCG/ACT IN AERS: 2.0000 | INHALATION_SPRAY | Freq: Four times a day (QID) | RESPIRATORY_TRACT | Status: DC | PRN

## 2012-05-15 MED ORDER — STARCH (THICKENING) PO POWD
ORAL | Status: DC | PRN
  Filled 2012-05-15: qty 227

## 2012-05-15 MED ORDER — ALBUTEROL SULFATE HFA 108 (90 BASE) MCG/ACT IN AERS
2.0000 | INHALATION_SPRAY | Freq: Four times a day (QID) | RESPIRATORY_TRACT | Status: DC | PRN
  Filled 2012-05-15: qty 6.7

## 2012-05-15 MED ORDER — ASPIRIN 325 MG PO TABS: 325.0000 mg | ORAL_TABLET | Freq: Every day | ORAL | Status: DC

## 2012-05-15 NOTE — Discharge Summary (Signed)
Physician Discharge Summary  NAME:Candace Perkins  ZOX:[id number]  DOB: 1920-03-16   Admit date: 05/13/2012 Discharge date: 05/15/2012  Admitting Diagnosis: stroke  Discharge Diagnoses:  Active Hospital Problems   Diagnosis Date Noted  . Stroke - with left face, arm weakness 05/13/2012  . Wheezing 05/15/2012  . Hypothyroidism 05/13/2012  . Hypertension     Resolved Hospital Problems   Diagnosis Date Noted Date Resolved  No resolved problems to display.    Discharge Condition: improved  Hospital Course: Patient admitted with dizziness, left facial and left arm weakness. Found to have a deep white matter stroke. Etiology presumed to be thrombotic and ASA added. She is already on a statin. No new symptoms during admission. Discharged home with plans for out patient PT, OT and possible ST  She had some wheezing and albuterol MDI given to patient. She was instructed in use. She has had some wheezing in the remote past.   Consults: Neurology  Disposition: home  Discharge Orders   Future Orders Complete By Expires     Diet - low sodium heart healthy  As directed     Comments:      Your diet needs to have mechanically soft solids and nectar thick liquids for now. You may need to buy some thickener at the pharmacy to put in your thin liquids. Hopefully, this will be temporary, but do not change until Dr. Valentina Lucks tells you to. Drinking thin liquids right now will put at risk for pneumonia.    Discharge instructions  As directed     Comments:      (1) Stay with family or have family stay with you for the next two days to be sure you are safe; (2) call if you have any recurrent symptoms; (3) we will contact PT and OT Monday and start that process    Increase activity slowly  As directed         Medication List    STOP taking these medications       meclizine 12.5 MG tablet  Commonly known as:  ANTIVERT      TAKE these medications       acetaminophen 500 MG tablet  Commonly known  as:  TYLENOL  Take 1,000 mg by mouth every 6 (six) hours as needed for pain.     albuterol 108 (90 BASE) MCG/ACT inhaler  Commonly known as:  PROVENTIL HFA;VENTOLIN HFA  Inhale 2 puffs into the lungs every 6 (six) hours as needed for wheezing or shortness of breath.     aspirin 325 MG tablet  Take 1 tablet (325 mg total) by mouth daily.     hydrochlorothiazide 12.5 MG capsule  Commonly known as:  MICROZIDE  Take 12.5 mg by mouth daily.     hydroxypropyl methylcellulose 2.5 % ophthalmic solution  Commonly known as:  ISOPTO TEARS  Place 1 drop into both eyes 3 (three) times daily as needed (for dry eyes).     levothyroxine 50 MCG tablet  Commonly known as:  SYNTHROID, LEVOTHROID  Take 50 mcg by mouth daily.     metoprolol succinate 50 MG 24 hr tablet  Commonly known as:  TOPROL-XL  Take 50 mg by mouth daily. Take with or immediately following a meal.     multivitamin with minerals Tabs  Take 1 tablet by mouth daily.     pravastatin 40 MG tablet  Commonly known as:  PRAVACHOL  Take 40 mg by mouth daily.  Follow-up Information   Follow up with Gates Rigg, MD. Schedule an appointment as soon as possible for a visit in 2 months. (stroke clinic)    Contact information:   [address] [place] [address] [phone]       Follow up with Lillia Mountain, MD. (we will call you to arrange this )    Contact information:   [address], [address] AND [place] [address] [phone]       Things to follow up in the outpatient setting: set up PT, OT, possibly ST.   Time coordinating discharge: 30 minutes including medication reconciliation, transmission of prescriptions to pharmacy, preparation of discharge papers, and discussion with patient  and family    Signed: Darnelle Bos 05/15/2012, 9:10 AM

## 2012-05-15 NOTE — Progress Notes (Signed)
Discharged home in the care of her daughter and son in law.  Discharge instructions reviewed, diet and dysphagia reviewed; home PT/OT reviewed; arrangements made threw case management.  Patient and daughter acknowledged the dysphagia precautions.

## 2012-05-15 NOTE — Progress Notes (Signed)
Physical Therapy Treatment Patient Details Name: ZANIA KALISZ MRN: [id number] DOB: Nov 22, 1920 Today's Date: 05/15/2012 Time:  -     PT Assessment / Plan / Recommendation Comments on Treatment Session  Pt very pleasant & willing to participate.   Moving well but cont's to have decreased attention to Lt side & Lt side weakness.  Pt's daughter present & reports she will be staying with mother for initial 2-3 days.       Follow Up Recommendations  Home health PT (per family request)     Does the patient have the potential to tolerate intense rehabilitation     Barriers to Discharge        Equipment Recommendations  None recommended by PT    Recommendations for Other Services    Frequency Min 4X/week   Plan      Precautions / Restrictions Precautions Precautions: Fall Restrictions Weight Bearing Restrictions: No       Mobility  Bed Mobility Bed Mobility: Not assessed Transfers Transfers: Sit to Stand;Stand to Sit Sit to Stand: With upper extremity assist;With armrests;From chair/3-in-1;4: Min guard Stand to Sit: 5: Supervision;With upper extremity assist;With armrests;To chair/3-in-1 Details for Transfer Assistance: Min guard for safety. Pt slightly unsteady upon initially standing but able to correct and regain balance without physical assist. Ambulation/Gait Ambulation/Gait Assistance: 4: Min guard Ambulation Distance (Feet): 340 Feet Assistive device: None Ambulation/Gait Assistance Details: Occassional cueing for safe navigation around obstacles & attention to Lt side.   Gait Pattern: Step-through pattern (Decreased floor clearance Lt LE.  ) Stairs: No Wheelchair Mobility Wheelchair Mobility: No Modified Rankin (Stroke Patients Only) Pre-Morbid Rankin Score: No symptoms Modified Rankin: Moderate disability      PT Goals Acute Rehab PT Goals Time For Goal Achievement: 05/21/12 Potential to Achieve Goals: Good Pt will go Supine/Side to Sit: with modified  independence Pt will go Sit to Supine/Side: with modified independence Pt will go Sit to Stand: with modified independence PT Goal: Sit to Stand - Progress: Progressing toward goal Pt will go Stand to Sit: with modified independence PT Goal: Stand to Sit - Progress: Progressing toward goal Pt will Ambulate: >150 feet;with modified independence PT Goal: Ambulate - Progress: Progressing toward goal  Visit Information  Last PT Received On: 05/15/12 Assistance Needed: +1    Subjective Data      Cognition  Cognition Overall Cognitive Status: Appears within functional limits for tasks assessed/performed Arousal/Alertness: Awake/alert Orientation Level: Appears intact for tasks assessed Behavior During Session: [facility] for tasks performed    Balance  Static Standing Balance Static Standing - Balance Support: No upper extremity supported Static Standing - Level of Assistance: 6: Modified independent (Device/Increase time) High Level Balance High Level Balance Activites: Head turns;Direction changes;Sudden stops;Turns  End of Session PT - End of Session Equipment Utilized During Treatment: Gait belt Activity Tolerance: Patient tolerated treatment well Patient left: in chair;with call bell/phone within reach;with family/visitor present Nurse Communication: Mobility status     Verdell Face, [place] [phone] 05/15/2012

## 2012-05-15 NOTE — Progress Notes (Signed)
Occupational Therapy Treatment Patient Details Name: Candace Perkins MRN: [id number] DOB: September 29, 1920 Today's Date: 05/15/2012 Time: 1100-1131 OT Time Calculation (min): 31 min  OT Assessment / Plan / Recommendation Comments on Treatment Session Pt progressing well.  Continues with Lt inattention, but is improving and demonstrates improving coordination Lt. UE    Follow Up Recommendations  Outpatient OT    Barriers to Discharge       Equipment Recommendations  None recommended by OT    Recommendations for Other Services    Frequency Min 3X/week   Plan Discharge plan remains appropriate    Precautions / Restrictions Precautions Precautions: Fall Restrictions Weight Bearing Restrictions: No   Pertinent Vitals/Pain     ADL  Grooming: Wash/dry hands;Supervision/safety Where Assessed - Grooming: Unsupported standing Upper Body Dressing: Minimal assistance (assist to hook bra) Where Assessed - Upper Body Dressing: Unsupported sitting Lower Body Dressing: Supervision/safety Where Assessed - Lower Body Dressing: Unsupported sit to stand Toilet Transfer: Supervision/safety Toilet Transfer Method: Sit to Barista: Comfort height toilet Toileting - Clothing Manipulation and Hygiene: Supervision/safety Where Assessed - Toileting Clothing Manipulation and Hygiene: Standing Transfers/Ambulation Related to ADLs: S ADL Comments: Pt requires min verbal cues to fully incorporate Lt. UE into activity, as pt performed ADLs that required bil. UEs to complete tasks, she used Lt. UE more spontaneously.  Spoke with pt and dtr re: recommendations for 24 hour supervision, no cooking without direct supervision/assist, and no driving.  They verbalized understanding    OT Diagnosis:    OT Problem List:   OT Treatment Interventions:     OT Goals Miscellaneous OT Goals OT Goal: Miscellaneous Goal #1 - Progress: Progressing toward goals OT Goal: Miscellaneous Goal #3 -  Progress: Met OT Goal: Miscellaneous Goal #4 - Progress: Met  Visit Information  Last OT Received On: 05/15/12 Assistance Needed: +1    Subjective Data      Prior Functioning       Cognition  Cognition Overall Cognitive Status: Appears within functional limits for tasks assessed/performed Arousal/Alertness: Awake/alert Orientation Level: Appears intact for tasks assessed Behavior During Session: [facility] for tasks performed    Mobility  Bed Mobility Bed Mobility: Not assessed Transfers Transfers: Stand to Sit;Sit to Stand Sit to Stand: 5: Supervision Stand to Sit: 5: Supervision    Exercises      Balance     End of Session OT - End of Session Activity Tolerance: Patient tolerated treatment well Patient left: in chair;with call bell/phone within reach;with family/visitor present  GO     Marchel Foote, Ursula Alert M 05/15/2012, 2:04 PM

## 2012-05-16 NOTE — Progress Notes (Signed)
   CARE MANAGEMENT NOTE 05/16/2012  Patient:  Candace Perkins, Candace Perkins   Account Number:  [id number]  Date Initiated:  05/16/2012  Documentation initiated by:  [facility]  Subjective/Objective Assessment:     Action/Plan:   Anticipated DC Date:  05/16/2012   Anticipated DC Plan:  HOME/SELF CARE      DC Planning Services  CM consult      Choice offered to / List presented to:             Status of service:  In process, will continue to follow Medicare Important Message given?   (If response is "NO", the following Medicare IM given date fields will be blank) Date Medicare IM given:   Date Additional Medicare IM given:    Discharge Disposition:    Per UR Regulation:    If discussed at Long Length of Stay Meetings, dates discussed:    Comments:  05/15/2012 1100 NCM spoke to pt and gave permission to speak to dtr, Marylu Lund. Dtr states pt lives in her home. Will follow up with MD about HH vs outpt. Pt wants HH PT/OT. MD wants pt to have outpt PT/OT. Will arrange outpt therapy on 05/17/2012. Isidoro Donning RN CCM Case Mgmt phone [phone]

## 2012-05-17 NOTE — Progress Notes (Signed)
   CARE MANAGEMENT NOTE 05/17/2012  Patient:  Candace Perkins, Candace Perkins   Account Number:  [id number]  Date Initiated:  05/16/2012  Documentation initiated by:  [facility]  Subjective/Objective Assessment:     Action/Plan:   lives at home wit dtr   Anticipated DC Date:  05/16/2012   Anticipated DC Plan:  HOME/SELF CARE      DC Planning Services  CM consult      [facility] Choice  HOME HEALTH   Choice offered to / List presented to:  C-4 Adult Children        HH arranged  HH-2 PT  HH-3 OT      HH agency  [facility]   Status of service:  Completed, signed off Medicare Important Message given?   (If response is "NO", the following Medicare IM given date fields will be blank) Date Medicare IM given:   Date Additional Medicare IM given:    Discharge Disposition:  HOME W HOME HEALTH SERVICES  Per UR Regulation:    If discussed at Long Length of Stay Meetings, dates discussed:    Comments:  05/17/2012 0930 NCM spoke to Dr. Valentina Lucks to clarify HH vs outpt. MD orders HH for pt. Contacted [facility] for [facility] PT/OT. Faxed orders. Notified dtr, Candace Perkins and gave her updated info on [facility] with contact number. Explained agency will notify her with appt times. Isidoro Donning RN CCM Case Mgmt phone [phone]  05/15/2012 1100 NCM spoke to pt and gave permission to speak to dtr, Candace Perkins. Dtr states pt lives in her home. Will follow up with MD about HH vs outpt. Pt wants HH PT/OT. MD wants pt to have outpt PT/OT. Will arrange outpt therapy on 05/17/2012. Isidoro Donning RN CCM Case Mgmt phone [phone]

## 2012-09-22 ENCOUNTER — Ambulatory Visit: Payer: Self-pay | Admitting: Neurology

## 2014-02-01 ENCOUNTER — Emergency Department (HOSPITAL_COMMUNITY)
Admission: EM | Admit: 2014-02-01 | Discharge: 2014-02-01 | Disposition: A | Discharge status: Home / Self Care | Payer: Medicare Other | Attending: Emergency Medicine | Admitting: Emergency Medicine

## 2014-02-01 ENCOUNTER — Encounter (HOSPITAL_COMMUNITY): Payer: Self-pay | Admitting: Emergency Medicine

## 2014-02-01 ENCOUNTER — Emergency Department (HOSPITAL_COMMUNITY): Payer: Medicare Other

## 2014-02-01 DIAGNOSIS — Z8673 Personal history of transient ischemic attack (TIA), and cerebral infarction without residual deficits: Secondary | ICD-10-CM | POA: Diagnosis not present

## 2014-02-01 DIAGNOSIS — I10 Essential (primary) hypertension: Secondary | ICD-10-CM | POA: Diagnosis not present

## 2014-02-01 DIAGNOSIS — Z79899 Other long term (current) drug therapy: Secondary | ICD-10-CM | POA: Insufficient documentation

## 2014-02-01 DIAGNOSIS — E079 Disorder of thyroid, unspecified: Secondary | ICD-10-CM | POA: Insufficient documentation

## 2014-02-01 DIAGNOSIS — Z880 Allergy status to penicillin: Secondary | ICD-10-CM | POA: Insufficient documentation

## 2014-02-01 DIAGNOSIS — Z7982 Long term (current) use of aspirin: Secondary | ICD-10-CM | POA: Diagnosis not present

## 2014-02-01 DIAGNOSIS — H538 Other visual disturbances: Secondary | ICD-10-CM | POA: Diagnosis not present

## 2014-02-01 LAB — DIFFERENTIAL
Basophils Absolute: 0 K/uL (ref 0.0–0.1)
Basophils Relative: 0 % (ref 0–1)
Eosinophils Absolute: 0.1 K/uL (ref 0.0–0.7)
Eosinophils Relative: 2 % (ref 0–5)
Lymphocytes Relative: 21 % (ref 12–46)
Lymphs Abs: 1.3 K/uL (ref 0.7–4.0)
Monocytes Absolute: 0.4 K/uL (ref 0.1–1.0)
Monocytes Relative: 6 % (ref 3–12)
Neutro Abs: 4.2 K/uL (ref 1.7–7.7)
Neutrophils Relative %: 71 % (ref 43–77)

## 2014-02-01 LAB — COMPREHENSIVE METABOLIC PANEL
ALT: 14 U/L (ref 0–35)
ANION GAP: 11 (ref 5–15)
AST: 19 U/L (ref 0–37)
Albumin: 4.3 g/dL (ref 3.5–5.2)
Alkaline Phosphatase: 60 U/L (ref 39–117)
BUN: 22 mg/dL (ref 6–23)
CALCIUM: 10.8 mg/dL — AB (ref 8.4–10.5)
CO2: 29 meq/L (ref 19–32)
Chloride: 98 mEq/L (ref 96–112)
Creatinine, Ser: 1.13 mg/dL — ABNORMAL HIGH (ref 0.50–1.10)
GFR, EST AFRICAN AMERICAN: 48 mL/min — AB (ref >90)
GFR, EST NON AFRICAN AMERICAN: 41 mL/min — AB (ref >90)
GLUCOSE: 101 mg/dL — AB (ref 70–99)
Potassium: 4.6 mEq/L (ref 3.7–5.3)
Sodium: 138 mEq/L (ref 137–147)
TOTAL PROTEIN: 7.7 g/dL (ref 6.0–8.3)
Total Bilirubin: 0.5 mg/dL (ref 0.3–1.2)

## 2014-02-01 LAB — CBC
HEMATOCRIT: 36.5 % (ref 36.0–46.0)
HEMOGLOBIN: 12.3 g/dL (ref 12.0–15.0)
MCH: 31.6 pg (ref 26.0–34.0)
MCHC: 33.7 g/dL (ref 30.0–36.0)
MCV: 93.8 fL (ref 78.0–100.0)
Platelets: 208 10*3/uL (ref 150–400)
RBC: 3.89 MIL/uL (ref 3.87–5.11)
RDW: 12.8 % (ref 11.5–15.5)
WBC: 6 10*3/uL (ref 4.0–10.5)

## 2014-02-01 LAB — PROTIME-INR
INR: 0.99 (ref 0.00–1.49)
PROTHROMBIN TIME: 13.2 s (ref 11.6–15.2)

## 2014-02-01 LAB — I-STAT TROPONIN, ED: Troponin i, poc: 0.01 ng/mL (ref 0.00–0.08)

## 2014-02-01 LAB — CBG MONITORING, ED: Glucose-Capillary: 89 mg/dL (ref 70–99)

## 2014-02-01 LAB — APTT: aPTT: 35 s (ref 24–37)

## 2014-02-01 NOTE — ED Notes (Signed)
MD Zavitz at bedside  

## 2014-02-01 NOTE — Discharge Instructions (Signed)
Continue to monitor blood pressure once daily keep a log and she'll your physician. Take medicines as directed. Come to the ER if you develop chest pain, shortness of breath, stroke symptoms or other.  If you were given medicines take as directed.  If you are on coumadin or contraceptives realize their levels and effectiveness is altered by many different medicines.  If you have any reaction (rash, tongues swelling, other) to the medicines stop taking and see a physician.   Please follow up as directed and return to the ER or see a physician for new or worsening symptoms.  Thank you. Filed Vitals:   02/01/14 1300 02/01/14 1312 02/01/14 1324 02/01/14 1330  BP: 126/49 130/54  132/56  Pulse: 68  71 75  Temp:      TempSrc:      Resp: 24  23 25   SpO2: 100%  100% 100%

## 2014-02-01 NOTE — ED Notes (Signed)
Pt c/o htn and blurry vision upon waking this am; pt sts hx of mini stroke and feels same

## 2014-02-01 NOTE — ED Notes (Signed)
Pt returned from CT °

## 2014-02-01 NOTE — ED Provider Notes (Signed)
CSN: [id number]     Arrival date & time 02/01/14  1114 History   First MD Initiated Contact with Patient 02/01/14 1207     Chief Complaint  Patient presents with  . Blurred Vision  . Hypertension     (Consider location/radiation/quality/duration/timing/severity/associated sxs/prior Treatment) HPI Comments: 80 year old female with history of stroke, high blood pressure presents with blurry vision and elevated blood pressure. Symptoms are started this morning. Currently patient has no symptoms, denies any focal deficits. Patient denies any head injury or falls. No chest pain shortness of breath or stroke symptoms except for mild bilateral blurry vision that was worse when the blood pressure is 150 systolic. Her blood pressures normally run 120 systolic. No change in medicines.  Patient is a 80 y.o. female presenting with hypertension. The history is provided by the patient.  Hypertension Pertinent negatives include no chest pain, no abdominal pain, no headaches and no shortness of breath.    Past Medical History  Diagnosis Date  . Hypertension   . Thyroid disease   . Vertigo    Past Surgical History  Procedure Laterality Date  . Abdominal hysterectomy    . Appendectomy    . Hernia repair     History reviewed. No pertinent family history. History  Substance Use Topics  . Smoking status: Never Smoker   . Smokeless tobacco: Never Used  . Alcohol Use: No   OB History    No data available     Review of Systems  Constitutional: Negative for fever and chills.  HENT: Negative for congestion.   Eyes: Positive for visual disturbance.  Respiratory: Negative for shortness of breath.   Cardiovascular: Negative for chest pain.  Gastrointestinal: Negative for vomiting and abdominal pain.  Genitourinary: Negative for dysuria and flank pain.  Musculoskeletal: Negative for back pain, neck pain and neck stiffness.  Skin: Negative for rash.  Neurological: Negative for syncope, speech  difficulty, weakness, light-headedness and headaches.      Allergies  Demerol and Penicillins  Home Medications   Prior to Admission medications   Medication Sig Start Date End Date Taking? Authorizing Provider  acetaminophen (TYLENOL) 500 MG tablet Take 1,000 mg by mouth at bedtime.    Yes Historical Provider, MD  albuterol (PROVENTIL HFA;VENTOLIN HFA) 108 (90 BASE) MCG/ACT inhaler Inhale 2 puffs into the lungs every 6 (six) hours as needed for wheezing or shortness of breath. 05/15/12  Yes Darnelle BosJames Charles Osborne, MD  aspirin EC 81 MG tablet Take 81 mg by mouth daily.   Yes Historical Provider, MD  CALCIUM PO Take 1 tablet by mouth daily.   Yes Historical Provider, MD  hydroxypropyl methylcellulose (ISOPTO TEARS) 2.5 % ophthalmic solution Place 1 drop into both eyes 3 (three) times daily as needed (for dry eyes).   Yes Historical Provider, MD  levothyroxine (SYNTHROID, LEVOTHROID) 50 MCG tablet Take 50 mcg by mouth daily.   Yes Historical Provider, MD  lisinopril-hydrochlorothiazide (PRINZIDE,ZESTORETIC) 10-12.5 MG per tablet Take 1 tablet by mouth daily. 01/11/14  Yes Historical Provider, MD  meclizine (ANTIVERT) 12.5 MG tablet Take 12.5 mg by mouth 3 (three) times daily as needed for dizziness.   Yes Historical Provider, MD  metoprolol succinate (TOPROL-XL) 50 MG 24 hr tablet Take 50 mg by mouth daily. Take with or immediately following a meal.   Yes Historical Provider, MD  Multiple Vitamin (MULTIVITAMIN WITH MINERALS) TABS Take 1 tablet by mouth daily.   Yes Historical Provider, MD  pravastatin (PRAVACHOL) 40 MG tablet Take 40 mg  by mouth daily.   Yes Historical Provider, MD  aspirin 325 MG tablet Take 1 tablet (325 mg total) by mouth daily. Patient not taking: Reported on 02/01/2014 05/15/12   Darnelle Bos, MD  hydrochlorothiazide (MICROZIDE) 12.5 MG capsule Take 12.5 mg by mouth daily.    Historical Provider, MD   BP 112/53 mmHg  Pulse 66  Temp(Src) 97.5 F (36.4 C) (Oral)   Resp 24  SpO2 100% Physical Exam  Constitutional: She is oriented to person, place, and time. She appears well-developed and well-nourished.  HENT:  Head: Normocephalic and atraumatic.  Eyes: Conjunctivae are normal. Right eye exhibits no discharge. Left eye exhibits no discharge.  Neck: Normal range of motion. Neck supple. No tracheal deviation present.  Cardiovascular: Normal rate and regular rhythm.   Pulmonary/Chest: Effort normal and breath sounds normal.  Abdominal: Soft. She exhibits no distension. There is no tenderness. There is no guarding.  Musculoskeletal: She exhibits no edema.  Neurological: She is alert and oriented to person, place, and time. Coordination normal.  5+ strength in UE and LE with f/e at major joints. Sensation to palpation intact in UE and LE. CNs 2-12 grossly intact.  EOMFI.  PERRL.   Finger nose and coordination intact bilateral.   Visual fields intact to finger testing.   Skin: Skin is warm. No rash noted.  Psychiatric: She has a normal mood and affect.  Nursing note and vitals reviewed.   ED Course  Procedures (including critical care time) Labs Review Labs Reviewed  COMPREHENSIVE METABOLIC PANEL - Abnormal; Notable for the following:    Glucose, Bld 101 (*)    Creatinine, Ser 1.13 (*)    Calcium 10.8 (*)    GFR calc non Af Amer 41 (*)    GFR calc Af Amer 48 (*)    All other components within normal limits  PROTIME-INR  APTT  CBC  DIFFERENTIAL  CBG MONITORING, ED  I-STAT TROPOININ, ED    Imaging Review Ct Head (brain) Wo Contrast  02/01/2014   CLINICAL DATA:  Blurred vision.  Hypertension.  EXAM: CT HEAD WITHOUT CONTRAST  TECHNIQUE: Contiguous axial images were obtained from the base of the skull through the vertex without intravenous contrast.  COMPARISON:  CT 05/13/2012  FINDINGS: Generalized atrophy is stable. Chronic infarct in the right centrum semiovale. Extensive basal ganglia calcification as well as calcification in the  cerebellum. This may be physiologic or related to hypercalcemic state.  Negative for acute infarct.  Negative for hemorrhage or mass.  Calvarium intact.  IMPRESSION: Atrophy and chronic infarct in the centrum semiovale on the right  No acute abnormality.   Electronically Signed   By: Marlan Palau M.D.   On: 02/01/2014 13:34     EKG Interpretation   Date/Time:  Wednesday February 01 2014 12:18:30 EST Ventricular Rate:  71 PR Interval:  164 QRS Duration: 95 QT Interval:  389 QTC Calculation: 423 R Axis:   1 Text Interpretation:  Sinus rhythm Atrial premature complexes Borderline  low voltage, extremity leads Confirmed by Caroleann Casler  MD, Waniya Hoglund ([id number]) on  02/01/2014 1:15:39 PM      MDM   Final diagnoses:  Blurred vision, bilateral  Essential hypertension   Well-appearing female presents with high blood pressure and bilateral blurry vision which is resolved. Normal neuro exam, smiling well-appearing. Screening blood work and CT head reviewed unremarkable. Patient with family and has close outpatient follow-up, reasons to return given.  Results and differential diagnosis were discussed with the patient/parent/guardian. Close  follow up outpatient was discussed, comfortable with the plan.   Medications - No data to display  Filed Vitals:   02/01/14 1312 02/01/14 1324 02/01/14 1330 02/01/14 1400  BP: 130/54  132/56 112/53  Pulse:  71 75 66  Temp:      TempSrc:      Resp:  23 25 24   SpO2:  100% 100% 100%    Final diagnoses:  Blurred vision, bilateral  Essential hypertension        Enid SkeensJoshua M Kaelene Elliston, MD 02/01/14 1406

## 2015-08-24 ENCOUNTER — Emergency Department (HOSPITAL_COMMUNITY): Payer: Medicare Other

## 2015-08-24 ENCOUNTER — Emergency Department (HOSPITAL_COMMUNITY)
Admission: EM | Admit: 2015-08-24 | Discharge: 2015-08-25 | Disposition: A | Discharge status: Home / Self Care | Payer: Medicare Other | Attending: Emergency Medicine | Admitting: Emergency Medicine

## 2015-08-24 ENCOUNTER — Encounter (HOSPITAL_COMMUNITY): Payer: Self-pay | Admitting: Emergency Medicine

## 2015-08-24 DIAGNOSIS — Z7982 Long term (current) use of aspirin: Secondary | ICD-10-CM | POA: Diagnosis not present

## 2015-08-24 DIAGNOSIS — I10 Essential (primary) hypertension: Secondary | ICD-10-CM | POA: Diagnosis not present

## 2015-08-24 DIAGNOSIS — Z79899 Other long term (current) drug therapy: Secondary | ICD-10-CM | POA: Diagnosis not present

## 2015-08-24 DIAGNOSIS — E079 Disorder of thyroid, unspecified: Secondary | ICD-10-CM | POA: Insufficient documentation

## 2015-08-24 DIAGNOSIS — Z5181 Encounter for therapeutic drug level monitoring: Secondary | ICD-10-CM | POA: Insufficient documentation

## 2015-08-24 DIAGNOSIS — R42 Dizziness and giddiness: Secondary | ICD-10-CM | POA: Insufficient documentation

## 2015-08-24 LAB — CBC
HEMATOCRIT: 33.5 % — AB (ref 36.0–46.0)
HEMOGLOBIN: 11.5 g/dL — AB (ref 12.0–15.0)
MCH: 32 pg (ref 26.0–34.0)
MCHC: 34.3 g/dL (ref 30.0–36.0)
MCV: 93.3 fL (ref 78.0–100.0)
Platelets: 167 10*3/uL (ref 150–400)
RBC: 3.59 MIL/uL — AB (ref 3.87–5.11)
RDW: 13 % (ref 11.5–15.5)
WBC: 9.4 10*3/uL (ref 4.0–10.5)

## 2015-08-24 LAB — DIFFERENTIAL
Basophils Absolute: 0 10*3/uL (ref 0.0–0.1)
Basophils Relative: 0 %
EOS PCT: 2 %
Eosinophils Absolute: 0.1 10*3/uL (ref 0.0–0.7)
LYMPHS ABS: 1.4 10*3/uL (ref 0.7–4.0)
LYMPHS PCT: 15 %
MONOS PCT: 8 %
Monocytes Absolute: 0.7 10*3/uL (ref 0.1–1.0)
NEUTROS PCT: 75 %
Neutro Abs: 7.1 10*3/uL (ref 1.7–7.7)

## 2015-08-24 LAB — PROTIME-INR
INR: 0.92 (ref 0.00–1.49)
Prothrombin Time: 12.6 seconds (ref 11.6–15.2)

## 2015-08-24 LAB — COMPREHENSIVE METABOLIC PANEL
ALBUMIN: 4.1 g/dL (ref 3.5–5.0)
ALK PHOS: 55 U/L (ref 38–126)
ALT: 14 U/L (ref 14–54)
ANION GAP: 8 (ref 5–15)
AST: 21 U/L (ref 15–41)
BILIRUBIN TOTAL: 0.8 mg/dL (ref 0.3–1.2)
BUN: 25 mg/dL — AB (ref 6–20)
CALCIUM: 10.1 mg/dL (ref 8.9–10.3)
CO2: 27 mmol/L (ref 22–32)
Chloride: 103 mmol/L (ref 101–111)
Creatinine, Ser: 1.48 mg/dL — ABNORMAL HIGH (ref 0.44–1.00)
GFR calc Af Amer: 34 mL/min — ABNORMAL LOW (ref >60)
GFR, EST NON AFRICAN AMERICAN: 29 mL/min — AB (ref >60)
Glucose, Bld: 111 mg/dL — ABNORMAL HIGH (ref 65–99)
Potassium: 3.9 mmol/L (ref 3.5–5.1)
Sodium: 138 mmol/L (ref 135–145)
TOTAL PROTEIN: 7 g/dL (ref 6.5–8.1)

## 2015-08-24 LAB — I-STAT TROPONIN, ED: TROPONIN I, POC: 0.08 ng/mL (ref 0.00–0.08)

## 2015-08-24 LAB — CBG MONITORING, ED: Glucose-Capillary: 107 mg/dL — ABNORMAL HIGH (ref 65–99)

## 2015-08-24 MED ORDER — DIAZEPAM 5 MG/ML IJ SOLN
2.5000 mg | Freq: Once | INTRAMUSCULAR | Status: AC
  Administered 2015-08-24: 2.5 mg via INTRAVENOUS
  Filled 2015-08-24: qty 2

## 2015-08-24 NOTE — ED Provider Notes (Addendum)
CSN: [id number]     Arrival date & time 08/24/15  2140 History    By signing my name below, I, Suzan Slick. Elon Spanner, attest that this documentation has been prepared under the direction and in the presence of Arby Barrette, MD.  Electronically Signed: Suzan Slick. Elon Spanner, ED Scribe. 08/24/2015. 11:36 PM.   Chief Complaint  Patient presents with  . Dizziness   HPI  HPI Comments: Candace Perkins is a 82 y.o. female with a PMHx of vertigo, HTN and thyroid disease who presents to the Emergency Department complaining of constant, unchanged dizziness- "feeling swimmy headed" onset 8:30 AM this morning after getting out of bed but prior to eating. Dizziness is exacerbated when moving the head from side to side. No alleviating factors at this time. She also reports intermittent nausea, mild HA, numbness to hands, and  blurred vision. Pt has tried prescribed Antivert with mild temporary improvement. Last dose taken just prior to arrival. Denies any recent fever, chills, vomiting, chest pain, or shortness of breath. Pt admits to a history of same with her history of vertigo. However, per chart review, pt has not been evaluated in the Emergency Department for this concern.   Last MR/MRA performed on 05/13/12 which revealed acute infarct in the deep white matter on the right, mild chronic microvascular ischemic changes in the white matter and pons, mild atherosclerotic irregularity in the middle cerebral artery bilaterally, and no large vessel occlusion or critical stenosis.  PCP: Lillia Mountain, MD    Past Medical History  Diagnosis Date  . Hypertension   . Thyroid disease   . Vertigo    Past Surgical History  Procedure Laterality Date  . Abdominal hysterectomy    . Appendectomy    . Hernia repair     History reviewed. No pertinent family history. Social History  Substance Use Topics  . Smoking status: Never Smoker   . Smokeless tobacco: Never Used  . Alcohol Use: No   OB History    No data  available     Review of Systems  A complete 10 system review of systems was obtained and all systems are negative except as noted in the HPI and PMH.    Allergies  Demerol and Penicillins  Home Medications   Prior to Admission medications   Medication Sig Start Date End Date Taking? Authorizing Provider  albuterol (PROVENTIL HFA;VENTOLIN HFA) 108 (90 BASE) MCG/ACT inhaler Inhale 2 puffs into the lungs every 6 (six) hours as needed for wheezing or shortness of breath. 05/15/12  Yes Deretha Emory, MD  aspirin EC 81 MG tablet Take 81 mg by mouth daily.   Yes Historical Provider, MD  CALCIUM PO Take 1 tablet by mouth daily.   Yes Historical Provider, MD  diazepam (VALIUM) 5 MG tablet Take 0.5 tablets (2.5 mg total) by mouth every 8 (eight) hours as needed (VERTIGO. You may take in addition to your Antivert if you still have dizziness.). 08/25/15   Arby Barrette, MD  hydroxypropyl methylcellulose (ISOPTO TEARS) 2.5 % ophthalmic solution Place 1 drop into both eyes 3 (three) times daily as needed (for dry eyes).   Yes Historical Provider, MD  levothyroxine (SYNTHROID, LEVOTHROID) 50 MCG tablet Take 50 mcg by mouth daily.   Yes Historical Provider, MD  lisinopril-hydrochlorothiazide (PRINZIDE,ZESTORETIC) 10-12.5 MG per tablet Take 1 tablet by mouth daily. 01/11/14  Yes Historical Provider, MD  meclizine (ANTIVERT) 12.5 MG tablet Take 12.5 mg by mouth 3 (three) times daily as needed for dizziness.  Yes Historical Provider, MD  meclizine (ANTIVERT) 25 MG tablet Take 1 tablet (25 mg total) by mouth 3 (three) times daily as needed for dizziness. 08/25/15   Arby BarretteMarcy Suvi Archuletta, MD  metoprolol succinate (TOPROL-XL) 25 MG 24 hr tablet Take 12.5 mg by mouth daily. 08/02/15  Yes Historical Provider, MD  mirtazapine (REMERON) 7.5 MG tablet Take 7.5 mg by mouth at bedtime as needed (for sleep.).   Yes Historical Provider, MD  Multiple Vitamin (MULTIVITAMIN WITH MINERALS) TABS Take 1 tablet by mouth daily.   Yes  Historical Provider, MD  pravastatin (PRAVACHOL) 40 MG tablet Take 40 mg by mouth every evening.    Yes Historical Provider, MD   Triage Vitals: BP 137/63 mmHg  Pulse 81  Resp 22  Ht 5\' 2"  (1.575 m)  Wt 122 lb (55.339 kg)  BMI 22.31 kg/m2  SpO2 99%   Physical Exam  Constitutional: She is oriented to person, place, and time. She appears well-developed and well-nourished.  HENT:  Head: Normocephalic.  Mouth/Throat: Oropharynx is clear and moist.  Eyes: EOM are normal.  Neck: Normal range of motion.  Cardiovascular: Normal rate and regular rhythm.   Murmur heard. Occasionally ectopic beat. 2/6 systolic murmur.  Pulmonary/Chest: Effort normal and breath sounds normal. She has no wheezes. She has no rales.  Abdominal: Soft. Bowel sounds are normal. She exhibits no distension.  Musculoskeletal: Normal range of motion.  No peripheral edema. calf's are nontender.  Neurological: She is alert and oriented to person, place, and time.  Cranial nerves 2-12 intact. Speech clear. Normal content. Upper extremity motor strength 5/5.  Psychiatric: She has a normal mood and affect. Judgment normal.  Nursing note and vitals reviewed.   ED Course  Procedures (including critical care time)  DIAGNOSTIC STUDIES: Oxygen Saturation is 99% on RA, Normal by my interpretation.    COORDINATION OF CARE: 11:07 PM- Will order blood work, imaging, and EKG. Will give Valium. Discussed treatment plan with pt at bedside and pt agreed to plan.     12:08 AM- Pt reports that she has had improvement after Valium. She is alert. Mental status and speech clear.  Labs Review Labs Reviewed  CBC - Abnormal; Notable for the following:    RBC 3.59 (*)    Hemoglobin 11.5 (*)    HCT 33.5 (*)    All other components within normal limits  COMPREHENSIVE METABOLIC PANEL - Abnormal; Notable for the following:    Glucose, Bld 111 (*)    BUN 25 (*)    Creatinine, Ser 1.48 (*)    GFR calc non Af Amer 29 (*)    GFR calc  Af Amer 34 (*)    All other components within normal limits  CBG MONITORING, ED - Abnormal; Notable for the following:    Glucose-Capillary 107 (*)    All other components within normal limits  DIFFERENTIAL  PROTIME-INR  I-STAT TROPOININ, ED    Imaging Review Ct Head Wo Contrast  08/24/2015  CLINICAL DATA:  82 year old female with progressively worsening vertigo. Dizziness and blurry vision. EXAM: CT HEAD WITHOUT CONTRAST TECHNIQUE: Contiguous axial images were obtained from the base of the skull through the vertex without intravenous contrast. COMPARISON:  02/01/2014 FINDINGS: No intracranial hemorrhage, mass effect, or midline shift. No hydrocephalus. The basilar cisterns are patent. No evidence of territorial infarct. Age-related atrophy and chronic small vessel ischemia. Remote lacunar infarct in the right centrum semiovale. Dense basal gangliar calcifications. Scattered cerebellar calcifications again seen. No intracranial fluid collection. Calvarium is  intact. Included paranasal sinuses and mastoid air cells are well aerated. IMPRESSION: 1.  No acute intracranial abnormality. 2. Age-related atrophy and chronic change. Remote lacunar infarct right centrum semi ovale unchanged. Electronically Signed   By: Rubye OaksMelanie  Ehinger M.D.   On: 08/24/2015 23:17   I have personally reviewed and evaluated these images and lab results as part of my medical decision-making.   EKG Interpretation   Date/Time:  Friday August 24 2015 22:32:27 EDT Ventricular Rate:  85 PR Interval:    QRS Duration: 94 QT Interval:  408 QTC Calculation: 486 R Axis:   36 Text Interpretation:  Sinus rhythm Supraventricular bigeminy Probable  anteroseptal infarct, old Confirmed by Donnald GarrePfeiffer, MD, Lebron ConnersMarcy [phone]([id number]) on  08/25/2015 12:53:28 AM      MDM   Final diagnoses:  Vertigo   Patient is alert and well in appearance. Vital signs are stable. She has symptoms consistent with vertigo. Patient reports she has had vertigo in the  past and takes meclizine. She was not getting significant relief with her meclizine today. She has normal neurologic examination. Patient did get improvement with administration of Valium. Mental status remained normal without sedation or confusion. At this time I will add Valium when necessary to her Antivert.  Arby BarretteMarcy Jedrek Dinovo, MD 08/25/15 [id number]  Arby BarretteMarcy Loralye Loberg, MD 08/25/15 [phone]

## 2015-08-24 NOTE — ED Notes (Signed)
Pt presents to ED with c/o dizziness.  Pt reports that she started having dizziness with nausea and blurry vision this morning at around 0800.  Pt reports symptoms come and go but tonight symptoms "seem to be worse".

## 2015-08-25 DIAGNOSIS — R42 Dizziness and giddiness: Secondary | ICD-10-CM | POA: Diagnosis not present

## 2015-08-25 MED ORDER — DIAZEPAM 5 MG PO TABS: 2.5000 mg | ORAL_TABLET | Freq: Three times a day (TID) | ORAL | Status: DC | PRN

## 2015-08-25 MED ORDER — MECLIZINE HCL 25 MG PO TABS: 25.0000 mg | ORAL_TABLET | Freq: Three times a day (TID) | ORAL | Status: DC | PRN

## 2015-08-25 MED ORDER — MECLIZINE HCL 25 MG PO TABS
25.0000 mg | ORAL_TABLET | Freq: Once | ORAL | Status: AC
  Administered 2015-08-25: 25 mg via ORAL
  Filled 2015-08-25: qty 1

## 2015-08-25 NOTE — Discharge Instructions (Signed)
Suspected Benign Positional Vertigo Vertigo is the feeling that you or your surroundings are moving when they are not. Benign positional vertigo is the most common form of vertigo. The cause of this condition is not serious (is benign). This condition is triggered by certain movements and positions (is positional). This condition can be dangerous if it occurs while you are doing something that could endanger you or others, such as driving.  CAUSES In many cases, the cause of this condition is not known. It may be caused by a disturbance in an area of the inner ear that helps your brain to sense movement and balance. This disturbance can be caused by a viral infection (labyrinthitis), head injury, or repetitive motion. RISK FACTORS This condition is more likely to develop in:  Women.  People who are 80 years of age or older. SYMPTOMS Symptoms of this condition usually happen when you move your head or your eyes in different directions. Symptoms may start suddenly, and they usually last for less than a minute. Symptoms may include:  Loss of balance and falling.  Feeling like you are spinning or moving.  Feeling like your surroundings are spinning or moving.  Nausea and vomiting.  Blurred vision.  Dizziness.  Involuntary eye movement (nystagmus). Symptoms can be mild and cause only slight annoyance, or they can be severe and interfere with daily life. Episodes of benign positional vertigo may return (recur) over time, and they may be triggered by certain movements. Symptoms may improve over time. DIAGNOSIS This condition is usually diagnosed by medical history and a physical exam of the head, neck, and ears. You may be referred to a health care provider who specializes in ear, nose, and throat (ENT) problems (otolaryngologist) or a provider who specializes in disorders of the nervous system (neurologist). You may have additional testing, including:  MRI.  A CT scan.  Eye movement  tests. Your health care provider may ask you to change positions quickly while he or she watches you for symptoms of benign positional vertigo, such as nystagmus. Eye movement may be tested with an electronystagmogram (ENG), caloric stimulation, the Dix-Hallpike test, or the roll test.  An electroencephalogram (EEG). This records electrical activity in your brain.  Hearing tests. TREATMENT Usually, your health care provider will treat this by moving your head in specific positions to adjust your inner ear back to normal. Surgery may be needed in severe cases, but this is rare. In some cases, benign positional vertigo may resolve on its own in 2-4 weeks. HOME CARE INSTRUCTIONS Safety  Move slowly.Avoid sudden body or head movements.  Avoid driving.  Avoid operating heavy machinery.  Avoid doing any tasks that would be dangerous to you or others if a vertigo episode would occur.  If you have trouble walking or keeping your balance, try using a cane for stability. If you feel dizzy or unstable, sit down right away.  Return to your normal activities as told by your health care provider. Ask your health care provider what activities are safe for you. General Instructions  Take over-the-counter and prescription medicines only as told by your health care provider.  Avoid certain positions or movements as told by your health care provider.  Drink enough fluid to keep your urine clear or pale yellow.  Keep all follow-up visits as told by your health care provider. This is important. SEEK MEDICAL CARE IF:  You have a fever.  Your condition gets worse or you develop new symptoms.  Your family or  friends notice any behavioral changes.  Your nausea or vomiting gets worse.  You have numbness or a "pins and needles" sensation. SEEK IMMEDIATE MEDICAL CARE IF:  You have difficulty speaking or moving.  You are always dizzy.  You faint.  You develop severe headaches.  You have  weakness in your legs or arms.  You have changes in your hearing or vision.  You develop a stiff neck.  You develop sensitivity to light.   This information is not intended to replace advice given to you by your health care provider. Make sure you discuss any questions you have with your health care provider.   Document Released: 11/11/2005 Document Revised: 10/25/2014 Document Reviewed: 05/29/2014 Elsevier Interactive Patient Education [employer]2016 Else[employer] Inc.

## 2015-08-25 NOTE — ED Notes (Signed)
Pt able to ambulate with assistance in the hall.

## 2018-08-07 ENCOUNTER — Emergency Department (HOSPITAL_COMMUNITY)
Admission: EM | Admit: 2018-08-07 | Discharge: 2018-08-07 | Disposition: A | Discharge status: Home / Self Care | Payer: Medicare Other | Attending: Emergency Medicine | Admitting: Emergency Medicine

## 2018-08-07 ENCOUNTER — Encounter (HOSPITAL_COMMUNITY): Payer: Self-pay

## 2018-08-07 ENCOUNTER — Other Ambulatory Visit: Payer: Self-pay

## 2018-08-07 DIAGNOSIS — R52 Pain, unspecified: Secondary | ICD-10-CM | POA: Diagnosis present

## 2018-08-07 DIAGNOSIS — Z79899 Other long term (current) drug therapy: Secondary | ICD-10-CM | POA: Insufficient documentation

## 2018-08-07 DIAGNOSIS — M25571 Pain in right ankle and joints of right foot: Secondary | ICD-10-CM | POA: Diagnosis not present

## 2018-08-07 DIAGNOSIS — M25572 Pain in left ankle and joints of left foot: Secondary | ICD-10-CM | POA: Insufficient documentation

## 2018-08-07 DIAGNOSIS — M542 Cervicalgia: Secondary | ICD-10-CM | POA: Diagnosis not present

## 2018-08-07 DIAGNOSIS — I10 Essential (primary) hypertension: Secondary | ICD-10-CM | POA: Diagnosis not present

## 2018-08-07 DIAGNOSIS — G8929 Other chronic pain: Secondary | ICD-10-CM | POA: Insufficient documentation

## 2018-08-07 DIAGNOSIS — E039 Hypothyroidism, unspecified: Secondary | ICD-10-CM | POA: Diagnosis not present

## 2018-08-07 DIAGNOSIS — Z7982 Long term (current) use of aspirin: Secondary | ICD-10-CM | POA: Insufficient documentation

## 2018-08-07 LAB — CBC WITH DIFFERENTIAL/PLATELET
Abs Immature Granulocytes: 0.02 10*3/uL (ref 0.00–0.07)
Basophils Absolute: 0 10*3/uL (ref 0.0–0.1)
Basophils Relative: 0 %
Eosinophils Absolute: 0 10*3/uL (ref 0.0–0.5)
Eosinophils Relative: 0 %
HCT: 32.9 % — ABNORMAL LOW (ref 36.0–46.0)
Hemoglobin: 10.5 g/dL — ABNORMAL LOW (ref 12.0–15.0)
Immature Granulocytes: 0 %
Lymphocytes Relative: 12 %
Lymphs Abs: 1.1 10*3/uL (ref 0.7–4.0)
MCH: 31.3 pg (ref 26.0–34.0)
MCHC: 31.9 g/dL (ref 30.0–36.0)
MCV: 97.9 fL (ref 80.0–100.0)
Monocytes Absolute: 1.2 10*3/uL — ABNORMAL HIGH (ref 0.1–1.0)
Monocytes Relative: 13 %
Neutro Abs: 6.6 10*3/uL (ref 1.7–7.7)
Neutrophils Relative %: 75 %
Platelets: 172 10*3/uL (ref 150–400)
RBC: 3.36 MIL/uL — ABNORMAL LOW (ref 3.87–5.11)
RDW: 12.5 % (ref 11.5–15.5)
WBC: 8.9 10*3/uL (ref 4.0–10.5)
nRBC: 0 % (ref 0.0–0.2)

## 2018-08-07 LAB — BASIC METABOLIC PANEL
Anion gap: 9 (ref 5–15)
BUN: 19 mg/dL (ref 8–23)
CO2: 28 mmol/L (ref 22–32)
Calcium: 8.7 mg/dL — ABNORMAL LOW (ref 8.9–10.3)
Chloride: 96 mmol/L — ABNORMAL LOW (ref 98–111)
Creatinine, Ser: 1.13 mg/dL — ABNORMAL HIGH (ref 0.44–1.00)
GFR calc Af Amer: 48 mL/min — ABNORMAL LOW (ref >60)
GFR calc non Af Amer: 41 mL/min — ABNORMAL LOW (ref >60)
Glucose, Bld: 106 mg/dL — ABNORMAL HIGH (ref 70–99)
Potassium: 3.9 mmol/L (ref 3.5–5.1)
Sodium: 133 mmol/L — ABNORMAL LOW (ref 135–145)

## 2018-08-07 LAB — SEDIMENTATION RATE: Sed Rate: 30 mm/hr — ABNORMAL HIGH (ref 0–22)

## 2018-08-07 LAB — C-REACTIVE PROTEIN: CRP: 4.3 mg/dL — ABNORMAL HIGH (ref <1.0)

## 2018-08-07 MED ORDER — LIDOCAINE 5 % EX PTCH
1.0000 | MEDICATED_PATCH | CUTANEOUS | Status: DC
  Administered 2018-08-07: 1 via TRANSDERMAL
  Filled 2018-08-07: qty 1

## 2018-08-07 MED ORDER — LIDOCAINE 5 % EX PTCH: 1.0000 | MEDICATED_PATCH | CUTANEOUS | 0 refills | Status: DC

## 2018-08-07 MED ORDER — TRAMADOL HCL 50 MG PO TABS
50.0000 mg | ORAL_TABLET | Freq: Once | ORAL | Status: AC
  Administered 2018-08-07: 50 mg via ORAL
  Filled 2018-08-07: qty 1

## 2018-08-07 NOTE — Discharge Instructions (Addendum)
You were seen in the emergency department for some right-sided neck pain along with worsening of your chronic bilateral ankle pain.  You had blood work that did not show an obvious cause of your symptoms.  This is likely muscular or possibly related to some arthritis symptoms.  Please continue ibuprofen and Tylenol as needed for your pain and we are prescribing you some Lidoderm patches to use topically.  Follow-up with your doctor and return if any worsening symptoms.

## 2018-08-07 NOTE — ED Triage Notes (Signed)
She c/o chronic pain in bilat. Ankles. She c/o atraumatic pain in left shoulder and neck x 1 week. She denies any current sign of illness and is in no distress. She is alert and oriented x 4.

## 2018-08-07 NOTE — ED Provider Notes (Signed)
[facility]-EMERGENCY DEPT Provider Note   CSN: [id number] Arrival date & time: 08/07/18  1308     History   Chief Complaint Chief Complaint  Patient presents with  . Generalized Body Aches    HPI Candace Perkins is a 85 y.o. female.  She has a history of hypertension and thyroid disease.  She is complaining of increased pain in her body.  She says her ankles and knees have been hurting her for years.  She does not know why.  Now for the past few days she has had pain in the right side of her neck.  She talked to a nurse at her PCPs office who said she was slept on it wrong.  Its not associated with any numbness or weakness.  She tried Tylenol without any improvement.  She lives alone and uses a walker to get around since her pain increased.  No falls.  No recent illness, no fevers cough shortness of breath abdominal pain vomiting or diarrhea.  No urinary symptoms.     The history is provided by the patient.  Neck Injury This is a new problem. The current episode started more than 2 days ago. The problem occurs constantly. The problem has not changed since onset.Pertinent negatives include no chest pain, no abdominal pain, no headaches and no shortness of breath. The symptoms are aggravated by twisting and bending. Nothing relieves the symptoms. She has tried acetaminophen for the symptoms. The treatment provided no relief.    Past Medical History:  Diagnosis Date  . Hypertension   . Thyroid disease   . Vertigo     Patient Active Problem List   Diagnosis Date Noted  . Wheezing 05/15/2012  . Stroke (HCC) 05/13/2012  . Hypothyroidism 05/13/2012  . Hypertension     Past Surgical History:  Procedure Laterality Date  . ABDOMINAL HYSTERECTOMY    . APPENDECTOMY    . HERNIA REPAIR       OB History   No obstetric history on file.      Home Medications    Prior to Admission medications   Medication Sig Start Date End Date Taking? Authorizing Provider   albuterol (PROVENTIL HFA;VENTOLIN HFA) 108 (90 BASE) MCG/ACT inhaler Inhale 2 puffs into the lungs every 6 (six) hours as needed for wheezing or shortness of breath. 05/15/12   Deretha Emorysborne, James C, MD  aspirin EC 81 MG tablet Take 81 mg by mouth daily.    [provider]  CALCIUM PO Take 1 tablet by mouth daily.    [provider]  diazepam (VALIUM) 5 MG tablet Take 0.5 tablets (2.5 mg total) by mouth every 8 (eight) hours as needed (VERTIGO. You may take in addition to your Antivert if you still have dizziness.). 08/25/15   Arby BarrettePfeiffer, Marcy, MD  hydroxypropyl methylcellulose (ISOPTO TEARS) 2.5 % ophthalmic solution Place 1 drop into both eyes 3 (three) times daily as needed (for dry eyes).    [provider]  levothyroxine (SYNTHROID, LEVOTHROID) 50 MCG tablet Take 50 mcg by mouth daily.    [provider]  lisinopril-hydrochlorothiazide (PRINZIDE,ZESTORETIC) 10-12.5 MG per tablet Take 1 tablet by mouth daily. 01/11/14   [provider]  meclizine (ANTIVERT) 12.5 MG tablet Take 12.5 mg by mouth 3 (three) times daily as needed for dizziness.    [provider]  meclizine (ANTIVERT) 25 MG tablet Take 1 tablet (25 mg total) by mouth 3 (three) times daily as needed for dizziness. 08/25/15   Pfeiffer,  Jeannie Done, MD  metoprolol succinate (TOPROL-XL) 25 MG 24 hr tablet Take 12.5 mg by mouth daily. 08/02/15   [provider]  mirtazapine (REMERON) 7.5 MG tablet Take 7.5 mg by mouth at bedtime as needed (for sleep.).    [provider]  Multiple Vitamin (MULTIVITAMIN WITH MINERALS) TABS Take 1 tablet by mouth daily.    [provider]  pravastatin (PRAVACHOL) 40 MG tablet Take 40 mg by mouth every evening.     [provider]    Family History No family history on file.  Social History Social History   Tobacco Use  . Smoking status: Never Smoker  . Smokeless tobacco: Never Used  Substance Use Topics  . Alcohol use: No  .  Drug use: No     Allergies   Demerol [meperidine] and Penicillins   Review of Systems Review of Systems  Constitutional: Negative for fever.  HENT: Negative for sore throat.   Eyes: Negative for visual disturbance.  Respiratory: Negative for shortness of breath.   Cardiovascular: Negative for chest pain.  Gastrointestinal: Negative for abdominal pain.  Genitourinary: Negative for dysuria.  Musculoskeletal: Positive for arthralgias and neck pain.  Skin: Negative for rash.  Neurological: Negative for headaches.     Physical Exam Updated Vital Signs BP 131/72 (BP Location: Left Arm)   Pulse 84   Temp 98.5 F (36.9 C) (Oral)   Resp 18   SpO2 97%   Physical Exam Vitals signs and nursing note reviewed.  Constitutional:      General: She is not in acute distress.    Appearance: She is well-developed.  HENT:     Head: Normocephalic and atraumatic.  Eyes:     Conjunctiva/sclera: Conjunctivae normal.  Neck:     Musculoskeletal: Neck supple.  Cardiovascular:     Rate and Rhythm: Normal rate and regular rhythm.     Heart sounds: No murmur.  Pulmonary:     Effort: Pulmonary effort is normal. No respiratory distress.     Breath sounds: Normal breath sounds.  Abdominal:     Palpations: Abdomen is soft.     Tenderness: There is no abdominal tenderness.  Musculoskeletal: Normal range of motion.     Right lower leg: No edema.     Left lower leg: No edema.     Comments: She has full range of motion of her upper and lower extremities with no limitations pain none of her joints appear acutely warm or swollen or red.  Skin:    General: Skin is warm and dry.     Capillary Refill: Capillary refill takes less than 2 seconds.  Neurological:     General: No focal deficit present.     Mental Status: She is alert and oriented to person, place, and time.     Sensory: No sensory deficit.     Motor: No weakness.      ED Treatments / Results  Labs (all labs ordered are listed,  but only abnormal results are displayed) Labs Reviewed  BASIC METABOLIC PANEL - Abnormal; Notable for the following components:      Result Value   Sodium 133 (*)    Chloride 96 (*)    Glucose, Bld 106 (*)    Creatinine, Ser 1.13 (*)    Calcium 8.7 (*)    GFR calc non Af Amer 41 (*)    GFR calc Af Amer 48 (*)    All other components within normal limits  CBC WITH DIFFERENTIAL/PLATELET - Abnormal;  Notable for the following components:   RBC 3.36 (*)    Hemoglobin 10.5 (*)    HCT 32.9 (*)    Monocytes Absolute 1.2 (*)    All other components within normal limits  SEDIMENTATION RATE - Abnormal; Notable for the following components:   Sed Rate 30 (*)    All other components within normal limits  C-REACTIVE PROTEIN - Abnormal; Notable for the following components:   CRP 4.3 (*)    All other components within normal limits    EKG    Radiology No results found.  Procedures Procedures (including critical care time)  Medications Ordered in ED Medications  traMADol (ULTRAM) tablet 50 mg (has no administration in time range)     Initial Impression / Assessment and Plan / ED Course  I have reviewed the triage vital signs and the nursing notes.  Pertinent labs & imaging results that were available during my care of the patient were reviewed by me and considered in my medical decision making (see chart for details).  Clinical Course as of Aug 06 2057  Sat Aug 07, 2018  [id number] 85 year old female here with chronic bilateral lower extremity pain and new right-sided neck pain, atraumatic.  She is nontoxic-appearing.  She has some reproducible muscular tenderness in her right trapezius.  Will check some labs including renal function and sed rate and try some tramadol for pain.   [MB]    Clinical Course User Index [MB] Terrilee FilesButler, Leshawn Straka C, MD        Final Clinical Impressions(s) / ED Diagnoses   Final diagnoses:  Neck pain on right side  Chronic pain of both ankles    ED  Discharge Orders         Ordered    lidocaine (LIDODERM) 5 %  Every 24 hours     08/07/18 1842           Terrilee FilesButler, Thor Nannini C, MD 08/07/18 2100

## 2019-04-16 ENCOUNTER — Ambulatory Visit: Payer: Medicare Other | Attending: Internal Medicine

## 2019-04-16 DIAGNOSIS — Z23 Encounter for immunization: Secondary | ICD-10-CM | POA: Insufficient documentation

## 2019-04-16 NOTE — Progress Notes (Signed)
   Covid-19 Vaccination Clinic  Name:  Candace Perkins    MRN: [id number] DOB: 1920/08/11  04/16/2019  [name] was observed post Covid-19 immunization for 15 minutes without incidence. She was provided with Vaccine Information Sheet and instruction to access the V-Safe system.   [name] was instructed to call 911 with any severe reactions post vaccine: Marland Kitchen Difficulty breathing  . Swelling of your face and throat  . A fast heartbeat  . A bad rash all over your body  . Dizziness and weakness    Immunizations Administered    Name Date Dose VIS Date Route   Pfizer COVID-19 Vaccine 04/16/2019  1:22 PM 0.3 mL 01/28/2019 Intramuscular   Manufacturer: [employer], [employer]   Lot: [id number]   NDC: [address]-1

## 2019-05-11 ENCOUNTER — Ambulatory Visit: Payer: Medicare Other | Attending: Internal Medicine

## 2019-05-11 DIAGNOSIS — Z23 Encounter for immunization: Secondary | ICD-10-CM

## 2019-05-11 NOTE — Progress Notes (Signed)
   Covid-19 Vaccination Clinic  Name:  CINTIA GLEED    MRN: [id number] DOB: 10/09/1920  05/11/2019  Ms. Lawless was observed post Covid-19 immunization for 15 minutes without incident. She was provided with Vaccine Information Sheet and instruction to access the V-Safe system.   Ms. Deakins was instructed to call 911 with any severe reactions post vaccine: Marland Kitchen Difficulty breathing  . Swelling of face and throat  . A fast heartbeat  . A bad rash all over body  . Dizziness and weakness   Immunizations Administered    Name Date Dose VIS Date Route   Pfizer COVID-19 Vaccine 05/11/2019  1:25 PM 0.3 mL 01/28/2019 Intramuscular   Manufacturer: [employer], [employer]   Lot: [id number]   NDC: [address]-4

## 2019-09-28 ENCOUNTER — Emergency Department (HOSPITAL_BASED_OUTPATIENT_CLINIC_OR_DEPARTMENT_OTHER): Payer: Medicare Other

## 2019-09-28 ENCOUNTER — Emergency Department (HOSPITAL_BASED_OUTPATIENT_CLINIC_OR_DEPARTMENT_OTHER)
Admission: EM | Admit: 2019-09-28 | Discharge: 2019-09-28 | Disposition: A | Discharge status: Home / Self Care | Payer: Medicare Other | Attending: Emergency Medicine | Admitting: Emergency Medicine

## 2019-09-28 ENCOUNTER — Other Ambulatory Visit: Payer: Self-pay

## 2019-09-28 ENCOUNTER — Encounter (HOSPITAL_BASED_OUTPATIENT_CLINIC_OR_DEPARTMENT_OTHER): Payer: Self-pay

## 2019-09-28 DIAGNOSIS — I10 Essential (primary) hypertension: Secondary | ICD-10-CM | POA: Insufficient documentation

## 2019-09-28 DIAGNOSIS — Z79899 Other long term (current) drug therapy: Secondary | ICD-10-CM | POA: Insufficient documentation

## 2019-09-28 DIAGNOSIS — R21 Rash and other nonspecific skin eruption: Secondary | ICD-10-CM | POA: Insufficient documentation

## 2019-09-28 DIAGNOSIS — R6883 Chills (without fever): Secondary | ICD-10-CM | POA: Diagnosis not present

## 2019-09-28 DIAGNOSIS — M79605 Pain in left leg: Secondary | ICD-10-CM | POA: Diagnosis present

## 2019-09-28 DIAGNOSIS — L03116 Cellulitis of left lower limb: Secondary | ICD-10-CM | POA: Insufficient documentation

## 2019-09-28 DIAGNOSIS — R600 Localized edema: Secondary | ICD-10-CM

## 2019-09-28 DIAGNOSIS — E039 Hypothyroidism, unspecified: Secondary | ICD-10-CM | POA: Diagnosis not present

## 2019-09-28 MED ORDER — DOXYCYCLINE HYCLATE 100 MG PO CAPS: 100.0000 mg | ORAL_CAPSULE | Freq: Two times a day (BID) | ORAL | 0 refills | Status: AC

## 2019-09-28 MED ORDER — DOXYCYCLINE HYCLATE 100 MG PO TABS
100.0000 mg | ORAL_TABLET | Freq: Once | ORAL | Status: AC
  Administered 2019-09-28: 100 mg via ORAL
  Filled 2019-09-28: qty 1

## 2019-09-28 NOTE — ED Triage Notes (Signed)
Pt c/o left LE swelling/pain x 3 weeks-states she was sent from PCP for r/o DVT-NAD-slow steady gait

## 2019-09-28 NOTE — Discharge Instructions (Signed)
Your history and exam are consistent with cellulitis now the DVT is ruled out.  You are not septic on your vital signs and otherwise appeared well.  Please call your doctor to discuss further blood pressure management and reassessment of your cellulitis.  If any symptoms change or worsen or the infection seems to be spreading more proximally, please return to the nearest emergency department for further evaluation and management.

## 2019-09-28 NOTE — ED Provider Notes (Signed)
[facility] EMERGENCY DEPARTMENT Provider Note   CSN: [id number] Arrival date & time: 09/28/19  1453     History Chief Complaint  Patient presents with   Leg Swelling    Kylieann TIYONNA SARDINHA is a 86 y.o. female.  The history is provided by the patient, a relative and medical records. No language interpreter was used.  Leg Pain Location:  Leg Injury: no   Leg location:  L lower leg Pain details:    Quality:  Aching   Radiates to:  Does not radiate   Severity:  Moderate   Onset quality:  Gradual   Duration:  3 weeks   Timing:  Constant   Progression:  Worsening Chronicity:  New Dislocation: no   Tetanus status:  Unknown Prior injury to area:  No Relieved by:  Nothing Worsened by:  Nothing Ineffective treatments:  None tried Associated symptoms: swelling   Associated symptoms: no back pain, no fatigue, no fever, no itching, no neck pain and no stiffness        Past Medical History:  Diagnosis Date   Hypertension    Thyroid disease    Vertigo     Patient Active Problem List   Diagnosis Date Noted   Wheezing 05/15/2012   Stroke (HCC) 05/13/2012   Hypothyroidism 05/13/2012   Hypertension     Past Surgical History:  Procedure Laterality Date   ABDOMINAL HYSTERECTOMY     APPENDECTOMY     HERNIA REPAIR       OB History   No obstetric history on file.     No family history on file.  Social History   Tobacco Use   Smoking status: Never Smoker   Smokeless tobacco: Never Used  Vaping Use   Vaping Use: Never used  Substance Use Topics   Alcohol use: No   Drug use: No    Home Medications Prior to Admission medications   Medication Sig Start Date End Date Taking? Authorizing Provider  albuterol (PROVENTIL HFA;VENTOLIN HFA) 108 (90 BASE) MCG/ACT inhaler Inhale 2 puffs into the lungs every 6 (six) hours as needed for wheezing or shortness of breath. 05/15/12   Deretha Emory, MD  aspirin EC 81 MG tablet Take 81 mg by mouth  daily.    [provider]  CALCIUM PO Take 1 tablet by mouth daily.    [provider]  diazepam (VALIUM) 5 MG tablet Take 0.5 tablets (2.5 mg total) by mouth every 8 (eight) hours as needed (VERTIGO. You may take in addition to your Antivert if you still have dizziness.). 08/25/15   Arby Barrette, MD  hydroxypropyl methylcellulose (ISOPTO TEARS) 2.5 % ophthalmic solution Place 1 drop into both eyes 3 (three) times daily as needed (for dry eyes).    [provider]  levothyroxine (SYNTHROID, LEVOTHROID) 50 MCG tablet Take 50 mcg by mouth daily.    [provider]  lidocaine (LIDODERM) 5 % Place 1 patch onto the skin daily. Remove & Discard patch within 12 hours or as directed by MD 08/07/18   Terrilee Files, MD  lisinopril-hydrochlorothiazide (PRINZIDE,ZESTORETIC) 10-12.5 MG per tablet Take 1 tablet by mouth daily. 01/11/14   [provider]  meclizine (ANTIVERT) 12.5 MG tablet Take 12.5 mg by mouth 3 (three) times daily as needed for dizziness.    [provider]  meclizine (ANTIVERT) 25 MG tablet Take 1 tablet (25 mg total) by mouth 3 (three) times daily as needed for dizziness. 08/25/15   Arby Barrette, MD  metoprolol succinate (TOPROL-XL) 25 MG 24 hr tablet Take 12.5 mg by mouth daily. 08/02/15   [provider]  mirtazapine (REMERON) 7.5 MG tablet Take 7.5 mg by mouth at bedtime as needed (for sleep.).    [provider]  Multiple Vitamin (MULTIVITAMIN WITH MINERALS) TABS Take 1 tablet by mouth daily.    [provider]  pravastatin (PRAVACHOL) 40 MG tablet Take 40 mg by mouth every evening.     [provider]    Allergies    Demerol [meperidine] and Penicillins  Review of Systems   Review of Systems  Constitutional: Positive for chills. Negative for diaphoresis, fatigue and fever.  HENT: Negative for congestion.   Eyes: Negative for visual disturbance.  Respiratory: Negative for cough, chest  tightness, shortness of breath and wheezing.   Cardiovascular: Positive for leg swelling. Negative for chest pain and palpitations.  Gastrointestinal: Negative for abdominal pain, constipation, diarrhea, nausea and vomiting.  Genitourinary: Negative for dysuria, flank pain and frequency.  Musculoskeletal: Negative for back pain, neck pain, neck stiffness and stiffness.  Skin: Positive for color change and rash. Negative for itching and wound.  Neurological: Negative for dizziness, weakness, light-headedness, numbness and headaches.  Psychiatric/Behavioral: Negative for agitation and confusion.  All other systems reviewed and are negative.   Physical Exam Updated Vital Signs BP (!) 160/82 (BP Location: Left Arm)    Pulse 93    Temp 98 F (36.7 C) (Oral)    Resp 18    Ht 5\' 1"  (1.549 m)    Wt 47.6 kg    SpO2 99%    BMI 19.84 kg/m   Physical Exam Vitals and nursing note reviewed.  Constitutional:      General: She is not in acute distress.    Appearance: She is well-developed. She is not ill-appearing, toxic-appearing or diaphoretic.  HENT:     Head: Normocephalic and atraumatic.     Right Ear: External ear normal.     Left Ear: External ear normal.     Nose: Nose normal. No congestion or rhinorrhea.     Mouth/Throat:     Mouth: Mucous membranes are moist.     Pharynx: No oropharyngeal exudate or posterior oropharyngeal erythema.  Eyes:     Conjunctiva/sclera: Conjunctivae normal.     Pupils: Pupils are equal, round, and reactive to light.  Cardiovascular:     Rate and Rhythm: Normal rate.     Pulses: Normal pulses.     Heart sounds: No murmur heard.   Pulmonary:     Effort: Pulmonary effort is normal. No respiratory distress.     Breath sounds: No stridor. No wheezing, rhonchi or rales.  Chest:     Chest wall: No tenderness.  Abdominal:     General: Abdomen is flat. There is no distension.     Tenderness: There is no abdominal tenderness. There is no right CVA tenderness,  left CVA tenderness or rebound.  Musculoskeletal:        General: Swelling and tenderness present. No signs of injury.     Cervical back: Normal range of motion and neck supple.     Right lower leg: No edema.     Left lower leg: Edema present.       Legs:  Skin:    General: Skin is warm.     Capillary Refill: Capillary refill takes less than 2 seconds.     Coloration: Skin is not pale.     Findings: No erythema or rash.  Neurological:     General: No focal deficit present.     Mental Status: She is alert and oriented to person, place, and time.     Sensory: No sensory deficit.     Motor: No weakness or abnormal muscle tone.     Coordination: Coordination normal.     Deep Tendon Reflexes: Reflexes are normal and symmetric.  Psychiatric:        Mood and Affect: Mood normal.     ED Results / Procedures / Treatments   Labs (all labs ordered are listed, but only abnormal results are displayed) Labs Reviewed - No data to display  EKG None  Radiology US Venous Img Lower Unilateral Left  Result Date: 09/28/2019 CLINICAL DATA:  Pain and swelling EXAM: RIGHT LOWER EXTREMITY VENOUS DOPPLER ULTRASOUND TECHNIQUE: Gray-scale sonography with compression, as well as color and duplex ultrasound, were performed to evaluate the deep venous system(s) from the level of the common femoral vein through the popliteal and proximal calf veins. COMPARISON:  None. FINDINGS: VENOUS Normal compressibility of the common femoral, superficial femoral, and popliteal veins, as well as the visualized calf veins. Visualized portions of profunda femoral vein and great saphenous vein unremarkable. No filling defects to suggest DVT on grayscale or color Doppler imaging. Doppler waveforms show normal direction of venous flow, normal respiratory plasticity and response to augmentation. Limited views of the contralateral common femoral vein are unremarkable. OTHER Mild right lower extremity edema is noted. Limitations:  none IMPRESSION: Negative. Electronically Signed   By: Katherine Mantle M.D.   On: 09/28/2019 16:14    Procedures Procedures (including critical care time)  Medications Ordered in ED Medications  doxycycline (VIBRA-TABS) tablet 100 mg (100 mg Oral Given 09/28/19 1734)    ED Course  I have reviewed the triage vital signs and the nursing notes.  Pertinent labs & imaging results that were available during my care of the patient were reviewed by me and considered in my medical decision making (see chart for details).    MDM Rules/Calculators/A&P                          Durene GIDGET QUIZHPI is a 86 y.o. female with a past medical history significant for hypertension, thyroid disease, vertigo, and prior stroke who presents from PCP for evaluation of left lower extremity pain and swelling to rule out DVT.  According to patient, for the last 3 weeks she has had worsening pain and swelling and some redness of the left lower leg.  She does report she may have scratched it and has some small skin injuries.  There is some surrounding erythema and warmth present.  She had no chest pain, shortness of breath, or fevers.  She does report some mild chills.  She denies any Covid symptoms or known Covid contacts.  She denies any history of DVT or PE.  She was told to come to the ED to rule out DVT.  On exam, she does have some swelling and tenderness and erythema of the left lower leg.  There are some scrapes and abrasions of the skin that does have surrounding erythema and warmth.  No fluctuance or drainage.  Normal sensation, strength, and pulse present.  Knee is nontender.  Hip is nontender.  Exam otherwise unremarkable.  Patient does report the symptoms have worsened since she had some blood pressure medication changes several weeks ago.  Her edema may be due to blood pressure fluctuations causing some  fluid to pull in the leg however, with the redness and warmth and skin injury, I am concerned about a mild  cellulitis.  DVT ultrasound was ordered in triage and was negative.  No clot seen.  Mild edema seen.  We had a shared decision-making conversation with patient and family and agreed to treat for cellulitis but otherwise hold on extensive laboratory work-up as she otherwise appears well and her vitals are reassuring aside from the blood pressure which she will call her doctor about.  Do not think x-ray would be very helpful at this time we have a very low suspicion for neck fascia or subcutaneous gas.  There is no focal injury to suggest a fracture.  She will be started on doxycycline will follow up.  She agrees with plan of care and was discharged in good condition with understanding of return precautions and strict follow-up instructions for worsening infection or any respiratory symptoms.   Final Clinical Impression(s) / ED Diagnoses Final diagnoses:  Leg edema  Left leg pain  Cellulitis of left lower extremity    Rx / DC Orders ED Discharge Orders         Ordered    doxycycline (VIBRAMYCIN) 100 MG capsule  2 times daily     Discontinue  Reprint     09/28/19 1731         Clinical Impression: 1. Leg edema   2. Left leg pain   3. Cellulitis of left lower extremity     Disposition: Discharge  Condition: Good  I have discussed the results, Dx and Tx plan with the pt(& family if present). He/she/they expressed understanding and agree(s) with the plan. Discharge instructions discussed at great length. Strict return precautions discussed and pt &/or family have verbalized understanding of the instructions. No further questions at time of discharge.    New Prescriptions   DOXYCYCLINE (VIBRAMYCIN) 100 MG CAPSULE    Take 1 capsule (100 mg total) by mouth 2 (two) times daily for 7 days.    Follow Up: Kirby FunkGriffin, John, MD [address] [place] [address] [phone]     [facility] [address] [id number] MW [id number] [address] [place] [id number] [phone]       Earnest Thalman, Canary Brimhristopher J, MD 09/28/19 1739

## 2019-10-11 ENCOUNTER — Encounter (HOSPITAL_COMMUNITY): Payer: Self-pay

## 2019-10-11 ENCOUNTER — Emergency Department (HOSPITAL_COMMUNITY)
Admission: EM | Admit: 2019-10-11 | Discharge: 2019-10-12 | Disposition: A | Discharge status: Home / Self Care | Payer: Medicare Other | Attending: Emergency Medicine | Admitting: Emergency Medicine

## 2019-10-11 ENCOUNTER — Other Ambulatory Visit: Payer: Self-pay

## 2019-10-11 DIAGNOSIS — I10 Essential (primary) hypertension: Secondary | ICD-10-CM | POA: Insufficient documentation

## 2019-10-11 DIAGNOSIS — Z79890 Hormone replacement therapy (postmenopausal): Secondary | ICD-10-CM | POA: Diagnosis not present

## 2019-10-11 DIAGNOSIS — K921 Melena: Secondary | ICD-10-CM | POA: Diagnosis present

## 2019-10-11 DIAGNOSIS — Z7982 Long term (current) use of aspirin: Secondary | ICD-10-CM | POA: Insufficient documentation

## 2019-10-11 DIAGNOSIS — Z79899 Other long term (current) drug therapy: Secondary | ICD-10-CM | POA: Insufficient documentation

## 2019-10-11 DIAGNOSIS — E039 Hypothyroidism, unspecified: Secondary | ICD-10-CM | POA: Insufficient documentation

## 2019-10-11 NOTE — ED Triage Notes (Signed)
Pt complains of rectal bleeding this evening, she states that she was a little constipated for two days and then she had a BM and it had blood in it

## 2019-10-12 ENCOUNTER — Encounter: Payer: Self-pay | Admitting: Gastroenterology

## 2019-10-12 NOTE — ED Provider Notes (Signed)
[facility]-EMERGENCY DEPT Provider Note  CSN: [id number] Arrival date & time: 10/11/19 2054  Chief Complaint(s) No chief complaint on file.  HPI Candace Perkins is a 86 y.o. female   The history is provided by the patient.  Rectal Bleeding Quality:  Maroon Amount:  Scant Timing: once. Chronicity:  New Similar prior episodes: no   Relieved by: self resolved. Worsened by:  Nothing Associated symptoms: no abdominal pain, no epistaxis, no fever, no hematemesis, no light-headedness, no loss of consciousness, no recent illness and no vomiting   Risk factors: no anticoagulant use    Reports having approx 5 BMs today, loose. Had a BM around 6p of a stool ball and noted maroon blood in TP, which cleared immediately. She has not noted blood on subsequent BMs.   Past Medical History Past Medical History:  Diagnosis Date   Hypertension    Thyroid disease    Vertigo    Patient Active Problem List   Diagnosis Date Noted   Wheezing 05/15/2012   Stroke (HCC) 05/13/2012   Hypothyroidism 05/13/2012   Hypertension    Home Medication(s) Prior to Admission medications   Medication Sig Start Date End Date Taking? Authorizing Provider  albuterol (PROVENTIL HFA;VENTOLIN HFA) 108 (90 BASE) MCG/ACT inhaler Inhale 2 puffs into the lungs every 6 (six) hours as needed for wheezing or shortness of breath. 05/15/12   Deretha Emory, MD  aspirin EC 81 MG tablet Take 81 mg by mouth daily.    [provider]  CALCIUM PO Take 1 tablet by mouth daily.    [provider]  diazepam (VALIUM) 5 MG tablet Take 0.5 tablets (2.5 mg total) by mouth every 8 (eight) hours as needed (VERTIGO. You may take in addition to your Antivert if you still have dizziness.). 08/25/15   Arby Barrette, MD  hydroxypropyl methylcellulose (ISOPTO TEARS) 2.5 % ophthalmic solution Place 1 drop into both eyes 3 (three) times daily as needed (for dry eyes).    [provider]    levothyroxine (SYNTHROID, LEVOTHROID) 50 MCG tablet Take 50 mcg by mouth daily.    [provider]  lidocaine (LIDODERM) 5 % Place 1 patch onto the skin daily. Remove & Discard patch within 12 hours or as directed by MD 08/07/18   Terrilee Files, MD  lisinopril-hydrochlorothiazide (PRINZIDE,ZESTORETIC) 10-12.5 MG per tablet Take 1 tablet by mouth daily. 01/11/14   [provider]  meclizine (ANTIVERT) 12.5 MG tablet Take 12.5 mg by mouth 3 (three) times daily as needed for dizziness.    [provider]  meclizine (ANTIVERT) 25 MG tablet Take 1 tablet (25 mg total) by mouth 3 (three) times daily as needed for dizziness. 08/25/15   Arby Barrette, MD  metoprolol succinate (TOPROL-XL) 25 MG 24 hr tablet Take 12.5 mg by mouth daily. 08/02/15   [provider]  mirtazapine (REMERON) 7.5 MG tablet Take 7.5 mg by mouth at bedtime as needed (for sleep.).    [provider]  Multiple Vitamin (MULTIVITAMIN WITH MINERALS) TABS Take 1 tablet by mouth daily.    [provider]  pravastatin (PRAVACHOL) 40 MG tablet Take 40 mg by mouth every evening.     [provider]  Past Surgical History Past Surgical History:  Procedure Laterality Date   ABDOMINAL HYSTERECTOMY     APPENDECTOMY     HERNIA REPAIR     Family History History reviewed. No pertinent family history.  Social History Social History   Tobacco Use   Smoking status: Never Smoker   Smokeless tobacco: Never Used  Building services engineer Use: Never used  Substance Use Topics   Alcohol use: No   Drug use: No   Allergies Demerol [meperidine] and Penicillins  Review of Systems Review of Systems  Constitutional: Negative for fever.  HENT: Negative for nosebleeds.   Gastrointestinal: Positive for hematochezia. Negative for abdominal pain,  hematemesis and vomiting.  Neurological: Negative for loss of consciousness and light-headedness.   All other systems are reviewed and are negative for acute change except as noted in the HPI  Physical Exam Vital Signs  I have reviewed the triage vital signs BP (!) 151/72    Pulse 91    Temp 97.8 F (36.6 C) (Oral)    Resp (!) 26    SpO2 99%   Physical Exam Vitals reviewed.  Constitutional:      General: She is not in acute distress.    Appearance: She is well-developed. She is not diaphoretic.  HENT:     Head: Normocephalic and atraumatic.     Right Ear: External ear normal.     Left Ear: External ear normal.     Nose: Nose normal.  Eyes:     General: No scleral icterus.    Conjunctiva/sclera: Conjunctivae normal.  Neck:     Trachea: Phonation normal.  Cardiovascular:     Rate and Rhythm: Normal rate and regular rhythm.  Pulmonary:     Effort: Pulmonary effort is normal. No respiratory distress.     Breath sounds: No stridor.  Abdominal:     General: There is no distension.     Tenderness: There is no abdominal tenderness.  Genitourinary:    Rectum: No external hemorrhoid.     Comments: Light brown stool. No frank blood. Musculoskeletal:        General: Normal range of motion.     Cervical back: Normal range of motion.  Neurological:     Mental Status: She is alert and oriented to person, place, and time.  Psychiatric:        Behavior: Behavior normal.     ED Results and Treatments Labs (all labs ordered are listed, but only abnormal results are displayed) Labs Reviewed - No data to display                                                                                                                       EKG  EKG Interpretation  Date/Time:    Ventricular Rate:    PR Interval:    QRS Duration:   QT Interval:    QTC Calculation:   R Axis:     Text Interpretation:  Radiology No results found.  Pertinent labs & imaging results that were  available during my care of the patient were reviewed by me and considered in my medical decision making (see chart for details).  Medications Ordered in ED Medications - No data to display                                                                                                                                  Procedures Procedures  (including critical care time)  Medical Decision Making / ED Course I have reviewed the nursing notes for this encounter and the patient's prior records (if available in EHR or on provided paperwork).   Quincey S Swett was evaluated in Emergency Department on 10/12/2019 for the symptoms described in the history of present illness. She was evaluated in the context of the global COVID-19 pandemic, which necessitated consideration that the patient might be at risk for infection with the SARS-CoV-2 virus that causes COVID-19. Institutional protocols and algorithms that pertain to the evaluation of patients at risk for COVID-19 are in a state of rapid change based on information released by regulatory bodies including the CDC and federal and state organizations. These policies and algorithms were followed during the patient's care in the ED.  One episode of hematochezia. Resolved. Not anticoagulated. Light brown stool in drawers here w/o blood. Abd benign.  No need for labs or imaging at this time. PCP/GI follow up recommended.  Daughter reports that they are trying to get patient in a sNF. Transitional care consult placed for assistance with placement.      Final Clinical Impression(s) / ED Diagnoses Final diagnoses:  Hematochezia   The patient appears reasonably screened and/or stabilized for discharge and I doubt any other medical condition or other [facility] requiring further screening, evaluation, or treatment in the ED at this time prior to discharge. Safe for discharge with strict return precautions.  Disposition: Discharge  Condition: Good  I have  discussed the results, Dx and Tx plan with the patient/family who expressed understanding and agree(s) with the plan. Discharge instructions discussed at length. The patient/family was given strict return precautions who verbalized understanding of the instructions. No further questions at time of discharge.    ED Discharge Orders    None       Follow Up: Gweneth Dimitri, MD [address] [place] [address] [phone]  Schedule an appointment as soon as possible for a visit  in 3-5 days, If symptoms do not improve or  worsen  [facility] [address] [place] [address] [phone] Schedule an appointment as soon as possible for a visit  As needed      This chart was dictated using voice recognition software.  Despite best efforts to proofread,  errors can occur which can change the documentation meaning.   Nira Conn, MD 10/12/19 [phone]

## 2019-10-12 NOTE — ED Notes (Signed)
ED Provider at bedside. 

## 2019-10-13 NOTE — Progress Notes (Addendum)
10/13/2019 200 pm TOC CM spoke to pt's dtr, Genevive Bi. Daughter states she is seeking Long Term Care for pt at a Nursing Facility. Explained to pt's dtr to follow up with PCP and they will provide her with FL2 to help with the process. Provided dtr with names of different facilities that offer LTC at Nursing Facility. Provided her with my contact number if she has any additional questions. Isidoro Donning RN CCM, [facility] ED TOC CM [phone]

## 2019-12-13 ENCOUNTER — Ambulatory Visit: Payer: PRIVATE HEALTH INSURANCE | Admitting: Gastroenterology

## 2020-07-06 ENCOUNTER — Emergency Department (HOSPITAL_COMMUNITY)
Admission: EM | Admit: 2020-07-06 | Discharge: 2020-07-07 | Disposition: A | Discharge status: Home / Self Care | Payer: Medicare Other | Attending: Emergency Medicine | Admitting: Emergency Medicine

## 2020-07-06 ENCOUNTER — Encounter (HOSPITAL_COMMUNITY): Payer: Self-pay

## 2020-07-06 DIAGNOSIS — E039 Hypothyroidism, unspecified: Secondary | ICD-10-CM | POA: Insufficient documentation

## 2020-07-06 DIAGNOSIS — I10 Essential (primary) hypertension: Secondary | ICD-10-CM | POA: Insufficient documentation

## 2020-07-06 DIAGNOSIS — R42 Dizziness and giddiness: Secondary | ICD-10-CM | POA: Insufficient documentation

## 2020-07-06 DIAGNOSIS — Z7982 Long term (current) use of aspirin: Secondary | ICD-10-CM | POA: Diagnosis not present

## 2020-07-06 DIAGNOSIS — Z79899 Other long term (current) drug therapy: Secondary | ICD-10-CM | POA: Insufficient documentation

## 2020-07-06 DIAGNOSIS — R112 Nausea with vomiting, unspecified: Secondary | ICD-10-CM | POA: Insufficient documentation

## 2020-07-06 LAB — URINALYSIS, ROUTINE W REFLEX MICROSCOPIC
Bilirubin Urine: NEGATIVE
Glucose, UA: NEGATIVE mg/dL
Hgb urine dipstick: NEGATIVE
Ketones, ur: 5 mg/dL — AB
Leukocytes,Ua: NEGATIVE
Nitrite: NEGATIVE
Protein, ur: NEGATIVE mg/dL
Specific Gravity, Urine: 1.014 (ref 1.005–1.030)
pH: 7 (ref 5.0–8.0)

## 2020-07-06 LAB — BASIC METABOLIC PANEL
Anion gap: 10 (ref 5–15)
BUN: 19 mg/dL (ref 8–23)
CO2: 23 mmol/L (ref 22–32)
Calcium: 8.5 mg/dL — ABNORMAL LOW (ref 8.9–10.3)
Chloride: 97 mmol/L — ABNORMAL LOW (ref 98–111)
Creatinine, Ser: 1 mg/dL (ref 0.44–1.00)
GFR, Estimated: 51 mL/min — ABNORMAL LOW (ref >60)
Glucose, Bld: 142 mg/dL — ABNORMAL HIGH (ref 70–99)
Potassium: 3.7 mmol/L (ref 3.5–5.1)
Sodium: 130 mmol/L — ABNORMAL LOW (ref 135–145)

## 2020-07-06 LAB — CBC
HCT: 36.2 % (ref 36.0–46.0)
Hemoglobin: 11.5 g/dL — ABNORMAL LOW (ref 12.0–15.0)
MCH: 31.5 pg (ref 26.0–34.0)
MCHC: 31.8 g/dL (ref 30.0–36.0)
MCV: 99.2 fL (ref 80.0–100.0)
Platelets: 175 10*3/uL (ref 150–400)
RBC: 3.65 MIL/uL — ABNORMAL LOW (ref 3.87–5.11)
RDW: 13 % (ref 11.5–15.5)
WBC: 7 10*3/uL (ref 4.0–10.5)
nRBC: 0 % (ref 0.0–0.2)

## 2020-07-06 MED ORDER — MECLIZINE HCL 25 MG PO TABS
12.5000 mg | ORAL_TABLET | Freq: Once | ORAL | Status: AC
  Administered 2020-07-06: 12.5 mg via ORAL
  Filled 2020-07-06: qty 1

## 2020-07-06 MED ORDER — SODIUM CHLORIDE 0.9 % IV BOLUS
500.0000 mL | Freq: Once | INTRAVENOUS | Status: AC
  Administered 2020-07-06: 500 mL via INTRAVENOUS

## 2020-07-06 MED ORDER — LORAZEPAM 2 MG/ML IJ SOLN
0.5000 mg | Freq: Once | INTRAMUSCULAR | Status: AC
  Administered 2020-07-06: 0.5 mg via INTRAVENOUS
  Filled 2020-07-06: qty 1

## 2020-07-06 MED ORDER — ONDANSETRON HCL 4 MG/2ML IJ SOLN: 4.0000 mg | Freq: Once | INTRAMUSCULAR | Status: AC

## 2020-07-06 MED ORDER — ONDANSETRON HCL 4 MG/2ML IJ SOLN
INTRAMUSCULAR | Status: AC
  Administered 2020-07-06: 4 mg via INTRAVENOUS
  Filled 2020-07-06: qty 2

## 2020-07-06 MED ORDER — MECLIZINE HCL 12.5 MG PO TABS: 12.5000 mg | ORAL_TABLET | Freq: Three times a day (TID) | ORAL | 0 refills | Status: AC | PRN

## 2020-07-06 NOTE — Discharge Instructions (Addendum)
It was our pleasure to provide your ER care today - we hope that you feel better.  Drink plenty of fluids/stay well hydrated.   Take antivert as need.   Follow up with primary care doctor this coming week if symptoms fail to improve/resolve.  Return to ER if worse, new symptoms, fevers, new or severe pain, persistent vomiting, numbness/weakness, problems with balance or coordination, change in speech or vision, or other concern.

## 2020-07-06 NOTE — ED Triage Notes (Signed)
Pt with hx vertigo began feeling room spinning and n/v. SNF attempted to give PO meds which she threw up. Denies cp. 4mg  zofran given IV by EMS

## 2020-07-06 NOTE — ED Notes (Signed)
Pt ambulated at baseline with her walker. Pt tolerated PO fluids with no issue. Pt states that she is feeling better

## 2020-07-06 NOTE — ED Provider Notes (Addendum)
[facility]-EMERGENCY DEPT Provider Note   CSN: [id number] Arrival date & time: 07/06/20  1832     History Chief Complaint  Patient presents with  . Dizziness    Candace Perkins is a 87 y.o. female.  Patient c/o vertigo acute onset this afternoon, with room spinning sensation and nausea/vomiting. Symptoms acute onset, mod-severe, constant, persistent, worse w head movement. Pt notes hx same. Denies headache. No neck pain. No chest pain or sob. No ear pain, tinnitus, or hearing loss. No uri symptoms. Denies change in speech or vision. No numbness or weakness.   The history is provided by the patient.  Dizziness Associated symptoms: nausea and vomiting   Associated symptoms: no chest pain, no headaches, no shortness of breath and no weakness        Past Medical History:  Diagnosis Date  . Hypertension   . Thyroid disease   . Vertigo     Patient Active Problem List   Diagnosis Date Noted  . Wheezing 05/15/2012  . Stroke (HCC) 05/13/2012  . Hypothyroidism 05/13/2012  . Hypertension     Past Surgical History:  Procedure Laterality Date  . ABDOMINAL HYSTERECTOMY    . APPENDECTOMY    . HERNIA REPAIR       OB History   No obstetric history on file.     History reviewed. No pertinent family history.  Social History   Tobacco Use  . Smoking status: Never Smoker  . Smokeless tobacco: Never Used  Vaping Use  . Vaping Use: Never used  Substance Use Topics  . Alcohol use: No  . Drug use: No    Home Medications Prior to Admission medications   Medication Sig Start Date End Date Taking? Authorizing Provider  albuterol (PROVENTIL HFA;VENTOLIN HFA) 108 (90 BASE) MCG/ACT inhaler Inhale 2 puffs into the lungs every 6 (six) hours as needed for wheezing or shortness of breath. 05/15/12   Deretha Emory, MD  aspirin EC 81 MG tablet Take 81 mg by mouth daily.    [provider]  CALCIUM PO Take 1 tablet by mouth daily.    [provider]  diazepam (VALIUM) 5 MG tablet Take 0.5 tablets (2.5 mg total) by mouth every 8 (eight) hours as needed (VERTIGO. You may take in addition to your Antivert if you still have dizziness.). 08/25/15   Arby Barrette, MD  hydroxypropyl methylcellulose (ISOPTO TEARS) 2.5 % ophthalmic solution Place 1 drop into both eyes 3 (three) times daily as needed (for dry eyes).    [provider]  levothyroxine (SYNTHROID, LEVOTHROID) 50 MCG tablet Take 50 mcg by mouth daily.    [provider]  lidocaine (LIDODERM) 5 % Place 1 patch onto the skin daily. Remove & Discard patch within 12 hours or as directed by MD 08/07/18   Terrilee Files, MD  lisinopril-hydrochlorothiazide (PRINZIDE,ZESTORETIC) 10-12.5 MG per tablet Take 1 tablet by mouth daily. 01/11/14   [provider]  meclizine (ANTIVERT) 12.5 MG tablet Take 12.5 mg by mouth 3 (three) times daily as needed for dizziness.    [provider]  meclizine (ANTIVERT) 25 MG tablet Take 1 tablet (25 mg total) by mouth 3 (three) times daily as needed for dizziness. 08/25/15   Arby Barrette, MD  metoprolol succinate (TOPROL-XL) 25 MG 24 hr tablet Take 12.5 mg by mouth daily. 08/02/15   [provider]  mirtazapine (REMERON) 7.5 MG tablet Take 7.5 mg by mouth at bedtime as needed (for sleep.).  [provider]  Multiple Vitamin (MULTIVITAMIN WITH MINERALS) TABS Take 1 tablet by mouth daily.    [provider]  pravastatin (PRAVACHOL) 40 MG tablet Take 40 mg by mouth every evening.     [provider]    Allergies    Demerol [meperidine] and Penicillins  Review of Systems   Review of Systems  Constitutional: Negative for fever.  HENT: Negative for ear pain and sore throat.   Eyes: Negative for visual disturbance.  Respiratory: Negative for cough and shortness of breath.   Cardiovascular: Negative for chest pain.  Gastrointestinal: Positive for nausea and vomiting. Negative for  abdominal pain.  Genitourinary: Negative for dysuria.  Musculoskeletal: Negative for neck pain.  Skin: Negative for rash.  Neurological: Positive for dizziness. Negative for speech difficulty, weakness, numbness and headaches.  Hematological: Does not bruise/bleed easily.  Psychiatric/Behavioral: Negative for confusion.    Physical Exam Updated Vital Signs Temp 97.6 F (36.4 C) (Oral)   Physical Exam Vitals and nursing note reviewed.  Constitutional:      Appearance: Normal appearance. She is well-developed.  HENT:     Head: Atraumatic.     Right Ear: Tympanic membrane normal.     Left Ear: Tympanic membrane normal.     Nose: Nose normal.     Mouth/Throat:     Mouth: Mucous membranes are moist.  Eyes:     General: No scleral icterus.    Conjunctiva/sclera: Conjunctivae normal.     Pupils: Pupils are equal, round, and reactive to light.  Neck:     Vascular: No carotid bruit.     Trachea: No tracheal deviation.  Cardiovascular:     Rate and Rhythm: Normal rate and regular rhythm.     Pulses: Normal pulses.     Heart sounds: Normal heart sounds. No murmur heard. No friction rub. No gallop.   Pulmonary:     Effort: Pulmonary effort is normal. No respiratory distress.     Breath sounds: Normal breath sounds.  Abdominal:     General: Bowel sounds are normal. There is no distension.     Palpations: Abdomen is soft.     Tenderness: There is no abdominal tenderness.  Genitourinary:    Comments: No cva tenderness.  Musculoskeletal:        General: No swelling.     Cervical back: Normal range of motion and neck supple. No rigidity. No muscular tenderness.  Skin:    General: Skin is warm and dry.     Findings: No rash.  Neurological:     Mental Status: She is alert.     Comments: Alert, speech normal, no dysarthria or aphasia. Motor intact bil, stre 5/5. Sens grossly intact. Transient unidirectional nystagmus with change in position/head movement.   Psychiatric:         Mood and Affect: Mood normal.     ED Results / Procedures / Treatments   Labs (all labs ordered are listed, but only abnormal results are displayed) Results for orders placed or performed during the hospital encounter of 07/06/20  Urinalysis, Routine w reflex microscopic  Result Value Ref Range   Color, Urine YELLOW YELLOW   APPearance CLEAR CLEAR   Specific Gravity, Urine 1.014 1.005 - 1.030   pH 7.0 5.0 - 8.0   Glucose, UA NEGATIVE NEGATIVE mg/dL   Hgb urine dipstick NEGATIVE NEGATIVE   Bilirubin Urine NEGATIVE NEGATIVE   Ketones, ur 5 (A) NEGATIVE mg/dL   Protein, ur NEGATIVE NEGATIVE mg/dL   Nitrite  NEGATIVE NEGATIVE   Leukocytes,Ua NEGATIVE NEGATIVE  CBC  Result Value Ref Range   WBC 7.0 4.0 - 10.5 K/uL   RBC 3.65 (L) 3.87 - 5.11 MIL/uL   Hemoglobin 11.5 (L) 12.0 - 15.0 g/dL   HCT 40.1 02.7 - 25.3 %   MCV 99.2 80.0 - 100.0 fL   MCH 31.5 26.0 - 34.0 pg   MCHC 31.8 30.0 - 36.0 g/dL   RDW 66.4 40.3 - 47.4 %   Platelets 175 150 - 400 K/uL   nRBC 0.0 0.0 - 0.2 %  Basic metabolic panel  Result Value Ref Range   Sodium 130 (L) 135 - 145 mmol/L   Potassium 3.7 3.5 - 5.1 mmol/L   Chloride 97 (L) 98 - 111 mmol/L   CO2 23 22 - 32 mmol/L   Glucose, Bld 142 (H) 70 - 99 mg/dL   BUN 19 8 - 23 mg/dL   Creatinine, Ser 2.59 0.44 - 1.00 mg/dL   Calcium 8.5 (L) 8.9 - 10.3 mg/dL   GFR, Estimated 51 (L) >60 mL/min   Anion gap 10 5 - 15    EKG None  Radiology No results found.  Procedures Procedures   Medications Ordered in ED Medications  ondansetron (ZOFRAN) injection 4 mg (4 mg Intravenous Given 07/06/20 1855)  LORazepam (ATIVAN) injection 0.5 mg (0.5 mg Intravenous Given 07/06/20 1856)    ED Course  I have reviewed the triage vital signs and the nursing notes.  Pertinent labs & imaging results that were available during my care of the patient were reviewed by me and considered in my medical decision making (see chart for details).    MDM Rules/Calculators/A&P                           Pt with zofran by EMS. Persistent nausea.   Reviewed nursing notes and prior charts for additional history.   Zofran iv. Ativan .5 mg iv. Ns bolus.   Labs sent.   Labs reviewed/interpreted by me - hct 36, normal.   Recheck pt feels improved. Requests meclizine now as is able to tolerate po - provided.   Pt has been ambulatory in ED - steady, no ataxia noted. Pt denies change in speech or vision. Notes feels same as w pt.   Pt currently appears stable for d/c.      Final Clinical Impression(s) / ED Diagnoses Final diagnoses:  None    Rx / DC Orders ED Discharge Orders    None          Cathren Laine, MD 07/06/20 2329

## 2020-07-06 NOTE — ED Notes (Signed)
[facility] called for pt transport back to [facility]

## 2021-02-26 ENCOUNTER — Emergency Department (HOSPITAL_BASED_OUTPATIENT_CLINIC_OR_DEPARTMENT_OTHER): Payer: Medicare Other

## 2021-02-26 ENCOUNTER — Emergency Department (HOSPITAL_BASED_OUTPATIENT_CLINIC_OR_DEPARTMENT_OTHER)
Admission: EM | Admit: 2021-02-26 | Discharge: 2021-02-27 | Disposition: A | Discharge status: Home / Self Care | Payer: Medicare Other | Attending: Emergency Medicine | Admitting: Emergency Medicine

## 2021-02-26 ENCOUNTER — Encounter (HOSPITAL_BASED_OUTPATIENT_CLINIC_OR_DEPARTMENT_OTHER): Payer: Self-pay | Admitting: Emergency Medicine

## 2021-02-26 DIAGNOSIS — Z79899 Other long term (current) drug therapy: Secondary | ICD-10-CM | POA: Insufficient documentation

## 2021-02-26 DIAGNOSIS — S0990XA Unspecified injury of head, initial encounter: Secondary | ICD-10-CM | POA: Insufficient documentation

## 2021-02-26 DIAGNOSIS — W228XXA Striking against or struck by other objects, initial encounter: Secondary | ICD-10-CM | POA: Diagnosis not present

## 2021-02-26 DIAGNOSIS — W19XXXA Unspecified fall, initial encounter: Secondary | ICD-10-CM

## 2021-02-26 DIAGNOSIS — W1839XA Other fall on same level, initial encounter: Secondary | ICD-10-CM | POA: Diagnosis not present

## 2021-02-26 MED ORDER — SODIUM CHLORIDE 0.9 % IV BOLUS
1000.0000 mL | Freq: Once | INTRAVENOUS | Status: AC
  Administered 2021-02-26: 1000 mL via INTRAVENOUS

## 2021-02-26 MED ORDER — MECLIZINE HCL 25 MG PO TABS
25.0000 mg | ORAL_TABLET | Freq: Once | ORAL | Status: AC
  Administered 2021-02-26: 25 mg via ORAL
  Filled 2021-02-26: qty 1

## 2021-02-26 MED ORDER — ACETAMINOPHEN 500 MG PO TABS
1000.0000 mg | ORAL_TABLET | Freq: Once | ORAL | Status: AC
  Administered 2021-02-26: 1000 mg via ORAL
  Filled 2021-02-26: qty 2

## 2021-02-26 MED ORDER — ONDANSETRON 4 MG PO TBDP
4.0000 mg | ORAL_TABLET | Freq: Once | ORAL | Status: AC
  Administered 2021-02-26: 4 mg via ORAL
  Filled 2021-02-26: qty 1

## 2021-02-26 MED ORDER — MECLIZINE HCL 25 MG PO TABS
25.0000 mg | ORAL_TABLET | Freq: Once | ORAL | Status: AC
  Administered 2021-02-26: 25 mg via ORAL
  Filled 2021-02-26 (×2): qty 1

## 2021-02-26 NOTE — ED Notes (Signed)
Patient transported to X-ray 

## 2021-02-26 NOTE — ED Provider Notes (Signed)
[facility] EMERGENCY DEPARTMENT Provider Note   CSN: [id number] Arrival date & time: 02/26/21  1758     History  Chief Complaint  Patient presents with   Candace Perkins Candace Perkins is a 88 y.o. female.  88 yo F with a chief complaints of fall.  Patient was trying to pick up a cupcake and she lost her balance and fell and struck the back of her head.  She then had sudden onset dizziness.  She has a history of vertigo and thinks this feels similar.  Typically gets medicines through the IV and feels better here.  She denies other injury.  Has chronic back pain but is unchanged and does not think she struck her back.  Denies chest pain shortness of breath abdominal pain.  The history is provided by the patient.  Fall This is a new problem. The current episode started less than 1 hour ago. The problem occurs constantly. The problem has not changed since onset.Associated symptoms include headaches. Pertinent negatives include no chest pain and no shortness of breath. Nothing aggravates the symptoms. Nothing relieves the symptoms. She has tried nothing for the symptoms. The treatment provided no relief.      Home Medications Prior to Admission medications   Medication Sig Start Date End Date Taking? Authorizing Provider  acetaminophen (TYLENOL) 500 MG tablet Take 500 mg by mouth every 12 (twelve) hours as needed for moderate pain or mild pain.    [provider]  aspirin EC 81 MG tablet Take 81 mg by mouth daily.    [provider]  buPROPion (WELLBUTRIN) 75 MG tablet Take 75 mg by mouth daily.    [provider]  calcium-vitamin D (OSCAL WITH D) 500-200 MG-UNIT tablet Take 1 tablet by mouth daily with breakfast.    [provider]  hydrochlorothiazide (MICROZIDE) 12.5 MG capsule Take 12.5 mg by mouth daily.    [provider]  levothyroxine (SYNTHROID, LEVOTHROID) 50 MCG tablet Take 50 mcg by mouth daily.    [provider]   LORazepam (ATIVAN) 0.5 MG tablet Take 0.5 mg by mouth at bedtime.    [provider]  meclizine (ANTIVERT) 12.5 MG tablet Take 1 tablet (12.5 mg total) by mouth 3 (three) times daily as needed for dizziness. 07/06/20   Lajean Saver, MD  meclizine (ANTIVERT) 25 MG tablet Take 1 tablet (25 mg total) by mouth 3 (three) times daily as needed for dizziness. 08/25/15   Charlesetta Shanks, MD  Melatonin 10 MG TABS Take 1 tablet by mouth at bedtime as needed (sleep).    [provider]  metoprolol succinate (TOPROL-XL) 25 MG 24 hr tablet Take 12.5 mg by mouth daily. 08/02/15   [provider]  Multiple Vitamin (MULTIVITAMIN WITH MINERALS) TABS Take 1 tablet by mouth daily.    [provider]  Multiple Vitamins-Minerals (PRESERVISION AREDS 2) CAPS Take 2 capsules by mouth in the morning.    [provider]  OVER THE COUNTER MEDICATION Take 1 tablet by mouth every 8 (eight) hours as needed (cough). CORICIDIN HBP cough/ cold 4-30mg     [provider]  Polyethyl Glycol-Propyl Glycol (SYSTANE) 0.4-0.3 % SOLN Apply 1 drop to eye every 6 (six) hours as needed (dry eys).    [provider]  pravastatin (PRAVACHOL) 40 MG tablet Take 40 mg by mouth every evening.     [provider]  traZODone (DESYREL) 50 MG tablet Take 25 mg by mouth at bedtime as  needed. 07/06/20   [provider]      Allergies    Citalopram, Demerol [meperidine], Mirtazapine, Penicillins, and Sertraline    Review of Systems   Review of Systems  Constitutional:  Negative for chills and fever.  HENT:  Negative for congestion and rhinorrhea.   Eyes:  Negative for redness and visual disturbance.  Respiratory:  Negative for shortness of breath and wheezing.   Cardiovascular:  Negative for chest pain and palpitations.  Gastrointestinal:  Negative for nausea and vomiting.  Genitourinary:  Negative for dysuria and urgency.  Musculoskeletal:  Negative for arthralgias and  myalgias.  Skin:  Negative for pallor and wound.  Neurological:  Positive for dizziness and headaches.   Physical Exam Updated Vital Signs BP (!) 185/74    Pulse 74    Resp (!) 26    SpO2 95%  Physical Exam Vitals and nursing note reviewed.  Constitutional:      General: She is not in acute distress.    Appearance: She is well-developed. She is not diaphoretic.  HENT:     Head: Normocephalic.     Comments: Small hematoma to the posterior occiput.  No break in the skin. Eyes:     Pupils: Pupils are equal, round, and reactive to light.  Cardiovascular:     Rate and Rhythm: Normal rate and regular rhythm.     Heart sounds: No murmur heard.   No friction rub. No gallop.  Pulmonary:     Effort: Pulmonary effort is normal.     Breath sounds: No wheezing or rales.  Abdominal:     General: There is no distension.     Palpations: Abdomen is soft.     Tenderness: There is no abdominal tenderness.  Musculoskeletal:        General: No tenderness.     Cervical back: Normal range of motion and neck supple.     Comments: Palpated from head to toe without other obvious noted areas of bony tenderness.  No midline spinal tenderness step-offs or deformities.  Skin:    General: Skin is warm and dry.  Neurological:     Mental Status: She is alert and oriented to person, place, and time.  Psychiatric:        Behavior: Behavior normal.    ED Results / Procedures / Treatments   Labs (all labs ordered are listed, but only abnormal results are displayed) Labs Reviewed - No data to display  EKG EKG Interpretation  Date/Time:  Tuesday February 26 2021 18:07:42 EST Ventricular Rate:  75 PR Interval:  56 QRS Duration: 91 QT Interval:  411 QTC Calculation: 460 R Axis:   83 Text Interpretation: Sinus rhythm Short PR interval Anterior infarct, old no wpw, prolonged qt or brugada No significant change since last tracing Confirmed by Deno Etienne [phone]) on 02/26/2021 6:39:05 PM  Radiology DG  Lumbar Spine Complete  Result Date: 02/26/2021 CLINICAL DATA:  Fall, back pain EXAM: LUMBAR SPINE - COMPLETE 4+ VIEW COMPARISON:  None. FINDINGS: Scoliosis and diffuse degenerative disc and facet disease throughout the lumbar spine. No fracture or subluxation. SI joints symmetric and unremarkable. Diffuse aortoiliac atherosclerosis. No aneurysm. IMPRESSION: Scoliosis and degenerative changes.  No acute bony abnormality. Electronically Signed   By: Rolm Baptise M.D.   On: 02/26/2021 21:29   CT Head Wo Contrast  Result Date: 02/26/2021 CLINICAL DATA:  Head trauma, minor (Age >= 65y); Neck trauma (Age >= 65y), fall EXAM: CT HEAD WITHOUT CONTRAST CT CERVICAL SPINE WITHOUT  CONTRAST TECHNIQUE: Multidetector CT imaging of the head and cervical spine was performed following the standard protocol without intravenous contrast. Multiplanar CT image reconstructions of the cervical spine were also generated. RADIATION DOSE REDUCTION: This exam was performed according to the departmental dose-optimization program which includes automated exposure control, adjustment of the mA and/or kV according to patient size and/or use of iterative reconstruction technique. COMPARISON:  MRI head 05/13/2012, CT head 08/24/2015 FINDINGS: CT HEAD FINDINGS BRAIN: BRAIN Cerebral ventricle sizes are concordant with the degree of cerebral volume loss. Patchy and confluent areas of decreased attenuation are noted throughout the deep and periventricular white matter of the cerebral hemispheres bilaterally, compatible with chronic microvascular ischemic disease. No evidence of large-territorial acute infarction. No parenchymal hemorrhage. No mass lesion. No extra-axial collection. No mass effect or midline shift. No hydrocephalus. Basilar cisterns are patent. Vascular: No hyperdense vessel. Skull: No acute fracture or focal lesion. Sinuses/Orbits: Paranasal sinuses and mastoid air cells are clear. Bilateral lens replacement. Otherwise the orbits  are unremarkable. Other: None. CT CERVICAL SPINE FINDINGS Alignment: Normal. Skull base and vertebrae: Multilevel severe degenerative changes of the spine. No acute fracture. No aggressive appearing focal osseous lesion or focal pathologic process. Soft tissues and spinal canal: No prevertebral fluid or swelling. No visible canal hematoma. Upper chest: Biapical pleural/pulmonary scarring. Paraseptal emphysematous changes. Other: Atherosclerotic plaque of the carotid artery within the neck. IMPRESSION: 1.  No acute intracranial abnormality. 2. No acute displaced fracture or traumatic listhesis of the cervical spine. Electronically Signed   By: Tish FredericksonMorgane  Naveau M.D.   On: 02/26/2021 18:36   CT Cervical Spine Wo Contrast  Result Date: 02/26/2021 CLINICAL DATA:  Head trauma, minor (Age >= 65y); Neck trauma (Age >= 65y), fall EXAM: CT HEAD WITHOUT CONTRAST CT CERVICAL SPINE WITHOUT CONTRAST TECHNIQUE: Multidetector CT imaging of the head and cervical spine was performed following the standard protocol without intravenous contrast. Multiplanar CT image reconstructions of the cervical spine were also generated. RADIATION DOSE REDUCTION: This exam was performed according to the departmental dose-optimization program which includes automated exposure control, adjustment of the mA and/or kV according to patient size and/or use of iterative reconstruction technique. COMPARISON:  MRI head 05/13/2012, CT head 08/24/2015 FINDINGS: CT HEAD FINDINGS BRAIN: BRAIN Cerebral ventricle sizes are concordant with the degree of cerebral volume loss. Patchy and confluent areas of decreased attenuation are noted throughout the deep and periventricular white matter of the cerebral hemispheres bilaterally, compatible with chronic microvascular ischemic disease. No evidence of large-territorial acute infarction. No parenchymal hemorrhage. No mass lesion. No extra-axial collection. No mass effect or midline shift. No hydrocephalus. Basilar  cisterns are patent. Vascular: No hyperdense vessel. Skull: No acute fracture or focal lesion. Sinuses/Orbits: Paranasal sinuses and mastoid air cells are clear. Bilateral lens replacement. Otherwise the orbits are unremarkable. Other: None. CT CERVICAL SPINE FINDINGS Alignment: Normal. Skull base and vertebrae: Multilevel severe degenerative changes of the spine. No acute fracture. No aggressive appearing focal osseous lesion or focal pathologic process. Soft tissues and spinal canal: No prevertebral fluid or swelling. No visible canal hematoma. Upper chest: Biapical pleural/pulmonary scarring. Paraseptal emphysematous changes. Other: Atherosclerotic plaque of the carotid artery within the neck. IMPRESSION: 1.  No acute intracranial abnormality. 2. No acute displaced fracture or traumatic listhesis of the cervical spine. Electronically Signed   By: Tish FredericksonMorgane  Naveau M.D.   On: 02/26/2021 18:36    Procedures Procedures    Medications Ordered in ED Medications  ondansetron (ZOFRAN-ODT) disintegrating tablet 4 mg (4 mg Oral  Given 02/26/21 1809)  meclizine (ANTIVERT) tablet 25 mg (25 mg Oral Given 02/26/21 1837)  sodium chloride 0.9 % bolus 1,000 mL ( Intravenous Stopped 02/26/21 1938)  acetaminophen (TYLENOL) tablet 1,000 mg (1,000 mg Oral Given 02/26/21 1952)  meclizine (ANTIVERT) tablet 25 mg (25 mg Oral Given 02/26/21 1952)  ondansetron (ZOFRAN-ODT) disintegrating tablet 4 mg (4 mg Oral Given 02/26/21 2134)    ED Course/ Medical Decision Making/ A&P                           Medical Decision Making  88 yo F who was having a cupcake for her birthday when she bent over to pick 1 up and she lost her balance and fell.  She struck her head and since then has been feeling dizzy.  I obtained a head CT which was negative for intracranial hemorrhage.  CT scan of the C-spine also negative.  She is given a bolus of IV fluids for her dizziness which seems to have helped her before on record review.  Have given her  a dose of meclizine.  Plan for reassessment attempt ambulation.  Ambulates without issue, complaining of some low back pain which she says is chronic at baseline but she feels like maybe it is little bit worse.  Usually improves when she sits straight up.  He would like to try and walk a little bit more.  Her dizziness has improved.  Will DC home.  PCP follow-up.  9:40 PM:  I have discussed the diagnosis/risks/treatment options with the patient and believe the pt to be eligible for discharge home to follow-up with PCP. We also discussed returning to the ED immediately if new or worsening sx occur. We discussed the sx which are most concerning (e.g., sudden worsening pain, fever, inability to tolerate by mouth) that necessitate immediate return. Medications administered to the patient during their visit and any new prescriptions provided to the patient are listed below.  Medications given during this visit Medications  ondansetron (ZOFRAN-ODT) disintegrating tablet 4 mg (4 mg Oral Given 02/26/21 1809)  meclizine (ANTIVERT) tablet 25 mg (25 mg Oral Given 02/26/21 1837)  sodium chloride 0.9 % bolus 1,000 mL ( Intravenous Stopped 02/26/21 1938)  acetaminophen (TYLENOL) tablet 1,000 mg (1,000 mg Oral Given 02/26/21 1952)  meclizine (ANTIVERT) tablet 25 mg (25 mg Oral Given 02/26/21 1952)  ondansetron (ZOFRAN-ODT) disintegrating tablet 4 mg (4 mg Oral Given 02/26/21 2134)     The patient appears reasonably screen and/or stabilized for discharge and I doubt any other medical condition or other [facility] requiring further screening, evaluation, or treatment in the ED at this time prior to discharge.          Final Clinical Impression(s) / ED Diagnoses Final diagnoses:  Fall, initial encounter    Rx / DC Orders ED Discharge Orders     None         Deno Etienne, DO 02/26/21 2140

## 2021-02-26 NOTE — Discharge Instructions (Signed)
Your x-ray of your back did not show a fracture.  Your CT scan of your head and neck did not show any bleeding inside your skull or any fracture to your neck.  Please follow-up with your family doctor in the office.  Whenever someone falls sometimes is a warning sign that there is something wrong they may need to evaluate you in the office to see if they can take any of your medicines away that can make you unsteady on your feet or have someone come to your house to make sure it safe or have you go through physical therapy.  Please return to the emergency department for worsening confusion and vomiting. Take tylenol for pain.

## 2021-02-26 NOTE — ED Notes (Addendum)
Pt ambulated with FWW (baseline), steady gait, no issues observed. MD made aware.

## 2021-02-26 NOTE — ED Triage Notes (Addendum)
Pt had mechanical fall trying to pick up a cupcake. Endorses hitting back of head. Denies loc and blood thinners. C/o nausea and dizziness. Hx of vertigo.

## 2021-02-27 ENCOUNTER — Other Ambulatory Visit: Payer: Self-pay

## 2021-02-27 ENCOUNTER — Encounter (HOSPITAL_BASED_OUTPATIENT_CLINIC_OR_DEPARTMENT_OTHER): Payer: Self-pay | Admitting: Emergency Medicine

## 2021-03-10 ENCOUNTER — Emergency Department (HOSPITAL_COMMUNITY): Payer: Medicare Other

## 2021-03-10 ENCOUNTER — Inpatient Hospital Stay (HOSPITAL_COMMUNITY)
Admission: EM | Admit: 2021-03-10 | Discharge: 2021-03-20 | DRG: 641 | Disposition: E | Payer: Medicare Other | Attending: Internal Medicine | Admitting: Internal Medicine

## 2021-03-10 ENCOUNTER — Other Ambulatory Visit: Payer: Self-pay

## 2021-03-10 ENCOUNTER — Encounter (HOSPITAL_COMMUNITY): Payer: Self-pay

## 2021-03-10 DIAGNOSIS — Z79890 Hormone replacement therapy (postmenopausal): Secondary | ICD-10-CM

## 2021-03-10 DIAGNOSIS — S22080A Wedge compression fracture of T11-T12 vertebra, initial encounter for closed fracture: Secondary | ICD-10-CM | POA: Diagnosis present

## 2021-03-10 DIAGNOSIS — R296 Repeated falls: Secondary | ICD-10-CM | POA: Diagnosis present

## 2021-03-10 DIAGNOSIS — Z7982 Long term (current) use of aspirin: Secondary | ICD-10-CM

## 2021-03-10 DIAGNOSIS — N1831 Chronic kidney disease, stage 3a: Secondary | ICD-10-CM | POA: Diagnosis present

## 2021-03-10 DIAGNOSIS — Z66 Do not resuscitate: Secondary | ICD-10-CM | POA: Diagnosis not present

## 2021-03-10 DIAGNOSIS — E861 Hypovolemia: Secondary | ICD-10-CM | POA: Diagnosis present

## 2021-03-10 DIAGNOSIS — E871 Hypo-osmolality and hyponatremia: Secondary | ICD-10-CM | POA: Diagnosis not present

## 2021-03-10 DIAGNOSIS — Z20822 Contact with and (suspected) exposure to covid-19: Secondary | ICD-10-CM | POA: Diagnosis present

## 2021-03-10 DIAGNOSIS — I5032 Chronic diastolic (congestive) heart failure: Secondary | ICD-10-CM | POA: Diagnosis present

## 2021-03-10 DIAGNOSIS — I4891 Unspecified atrial fibrillation: Secondary | ICD-10-CM | POA: Diagnosis not present

## 2021-03-10 DIAGNOSIS — M549 Dorsalgia, unspecified: Secondary | ICD-10-CM | POA: Diagnosis not present

## 2021-03-10 DIAGNOSIS — E039 Hypothyroidism, unspecified: Secondary | ICD-10-CM | POA: Diagnosis present

## 2021-03-10 DIAGNOSIS — Z880 Allergy status to penicillin: Secondary | ICD-10-CM

## 2021-03-10 DIAGNOSIS — I959 Hypotension, unspecified: Secondary | ICD-10-CM | POA: Diagnosis present

## 2021-03-10 DIAGNOSIS — R531 Weakness: Secondary | ICD-10-CM

## 2021-03-10 DIAGNOSIS — E876 Hypokalemia: Secondary | ICD-10-CM | POA: Diagnosis present

## 2021-03-10 DIAGNOSIS — E785 Hyperlipidemia, unspecified: Secondary | ICD-10-CM | POA: Diagnosis present

## 2021-03-10 DIAGNOSIS — M4854XA Collapsed vertebra, not elsewhere classified, thoracic region, initial encounter for fracture: Secondary | ICD-10-CM | POA: Diagnosis present

## 2021-03-10 DIAGNOSIS — R109 Unspecified abdominal pain: Secondary | ICD-10-CM

## 2021-03-10 DIAGNOSIS — I130 Hypertensive heart and chronic kidney disease with heart failure and stage 1 through stage 4 chronic kidney disease, or unspecified chronic kidney disease: Secondary | ICD-10-CM | POA: Diagnosis present

## 2021-03-10 DIAGNOSIS — I10 Essential (primary) hypertension: Secondary | ICD-10-CM | POA: Diagnosis present

## 2021-03-10 DIAGNOSIS — Z885 Allergy status to narcotic agent status: Secondary | ICD-10-CM

## 2021-03-10 DIAGNOSIS — E875 Hyperkalemia: Secondary | ICD-10-CM | POA: Diagnosis not present

## 2021-03-10 DIAGNOSIS — I69354 Hemiplegia and hemiparesis following cerebral infarction affecting left non-dominant side: Secondary | ICD-10-CM

## 2021-03-10 DIAGNOSIS — Z79899 Other long term (current) drug therapy: Secondary | ICD-10-CM

## 2021-03-10 DIAGNOSIS — Z888 Allergy status to other drugs, medicaments and biological substances status: Secondary | ICD-10-CM

## 2021-03-10 DIAGNOSIS — E860 Dehydration: Secondary | ICD-10-CM | POA: Diagnosis present

## 2021-03-10 DIAGNOSIS — E869 Volume depletion, unspecified: Secondary | ICD-10-CM | POA: Diagnosis present

## 2021-03-10 LAB — CBC
HCT: 34.1 % — ABNORMAL LOW (ref 36.0–46.0)
Hemoglobin: 11.5 g/dL — ABNORMAL LOW (ref 12.0–15.0)
MCH: 31.5 pg (ref 26.0–34.0)
MCHC: 33.7 g/dL (ref 30.0–36.0)
MCV: 93.4 fL (ref 80.0–100.0)
Platelets: 284 10*3/uL (ref 150–400)
RBC: 3.65 MIL/uL — ABNORMAL LOW (ref 3.87–5.11)
RDW: 13.2 % (ref 11.5–15.5)
WBC: 8.8 10*3/uL (ref 4.0–10.5)
nRBC: 0 % (ref 0.0–0.2)

## 2021-03-10 LAB — BASIC METABOLIC PANEL
Anion gap: 11 (ref 5–15)
BUN: 18 mg/dL (ref 8–23)
CO2: 30 mmol/L (ref 22–32)
Calcium: 8.5 mg/dL — ABNORMAL LOW (ref 8.9–10.3)
Chloride: 81 mmol/L — ABNORMAL LOW (ref 98–111)
Creatinine, Ser: 1.02 mg/dL — ABNORMAL HIGH (ref 0.44–1.00)
GFR, Estimated: 49 mL/min — ABNORMAL LOW (ref >60)
Glucose, Bld: 96 mg/dL (ref 70–99)
Potassium: 3.4 mmol/L — ABNORMAL LOW (ref 3.5–5.1)
Sodium: 122 mmol/L — ABNORMAL LOW (ref 135–145)

## 2021-03-10 MED ORDER — MORPHINE SULFATE (PF) 2 MG/ML IV SOLN
2.0000 mg | Freq: Once | INTRAVENOUS | Status: AC
  Administered 2021-03-10: 2 mg via INTRAVENOUS
  Filled 2021-03-10: qty 1

## 2021-03-10 MED ORDER — ONDANSETRON 4 MG PO TBDP
4.0000 mg | ORAL_TABLET | Freq: Once | ORAL | Status: AC
  Administered 2021-03-10: 4 mg via ORAL
  Filled 2021-03-10: qty 1

## 2021-03-10 MED ORDER — MECLIZINE HCL 25 MG PO TABS
25.0000 mg | ORAL_TABLET | Freq: Once | ORAL | Status: AC
  Administered 2021-03-10: 25 mg via ORAL
  Filled 2021-03-10: qty 1

## 2021-03-10 NOTE — ED Provider Notes (Signed)
[facility] Note   CSN: [id number] Arrival date & time: 03/04/2021  1906     History  Chief Complaint  Patient presents with   Back Pain    Candace Perkins is a 88 y.o. female.  HPI  88 year old female with past medical history of HTN, vertigo, hypothyroidism presents to the emergency department with multiple complaints.  She is mostly nonspecific of her complaints. Patient states for multiple months she has been having off and on again mid abdominal pain.  There is no associated nausea/vomiting/diarrhea or genitourinary symptoms.  Patient states a couple months ago she had a mechanical fall and since then has been having lower back pain.  And also for the past year she has been having numbness in her bilateral upper extremities, specifically her left hand that is intermittent as well.  No other associated neuro symptoms.  Home Medications Prior to Admission medications   Medication Sig Start Date End Date Taking? Authorizing Provider  acetaminophen (TYLENOL) 500 MG tablet Take 500 mg by mouth every 12 (twelve) hours as needed for moderate pain or mild pain.    [provider]  aspirin EC 81 MG tablet Take 81 mg by mouth daily.    [provider]  buPROPion (WELLBUTRIN) 75 MG tablet Take 75 mg by mouth daily.    [provider]  calcium-vitamin D (OSCAL WITH D) 500-200 MG-UNIT tablet Take 1 tablet by mouth daily with breakfast.    [provider]  hydrochlorothiazide (MICROZIDE) 12.5 MG capsule Take 12.5 mg by mouth daily.    [provider]  levothyroxine (SYNTHROID, LEVOTHROID) 50 MCG tablet Take 50 mcg by mouth daily.    [provider]  LORazepam (ATIVAN) 0.5 MG tablet Take 0.5 mg by mouth at bedtime.    [provider]  meclizine (ANTIVERT) 12.5 MG tablet Take 1 tablet (12.5 mg total) by mouth 3 (three) times daily as needed for dizziness. 07/06/20   Cathren Laine, MD  meclizine  (ANTIVERT) 25 MG tablet Take 1 tablet (25 mg total) by mouth 3 (three) times daily as needed for dizziness. 08/25/15   Arby Barrette, MD  Melatonin 10 MG TABS Take 1 tablet by mouth at bedtime as needed (sleep).    [provider]  metoprolol succinate (TOPROL-XL) 25 MG 24 hr tablet Take 12.5 mg by mouth daily. 08/02/15   [provider]  Multiple Vitamin (MULTIVITAMIN WITH MINERALS) TABS Take 1 tablet by mouth daily.    [provider]  Multiple Vitamins-Minerals (PRESERVISION AREDS 2) CAPS Take 2 capsules by mouth in the morning.    [provider]  OVER THE COUNTER MEDICATION Take 1 tablet by mouth every 8 (eight) hours as needed (cough). CORICIDIN HBP cough/ cold 4-30mg     [provider]  Polyethyl Glycol-Propyl Glycol (SYSTANE) 0.4-0.3 % SOLN Apply 1 drop to eye every 6 (six) hours as needed (dry eys).    [provider]  pravastatin (PRAVACHOL) 40 MG tablet Take 40 mg by mouth every evening.     [provider]  traZODone (DESYREL) 50 MG tablet Take 25 mg by mouth at bedtime as needed. 07/06/20   [provider]      Allergies    Citalopram, Demerol [meperidine], Mirtazapine, Penicillins, and Sertraline    Review of Systems   Review of Systems  Constitutional:  Negative for fever.  Respiratory:  Negative for shortness of breath.   Cardiovascular:  Negative for chest pain.  Gastrointestinal:  Positive for abdominal pain. Negative for diarrhea, nausea and vomiting.  Genitourinary:  Negative for difficulty urinating and dysuria.  Musculoskeletal:  Positive for back pain. Negative for neck pain.  Skin:  Negative for rash.  Neurological:  Positive for numbness. Negative for tremors, syncope, facial asymmetry, speech difficulty, weakness and headaches.   Physical Exam Updated Vital Signs BP (!) 157/74    Pulse 75    Temp 98 F (36.7 C) (Oral)    Resp (!) 25    SpO2 97%  Physical Exam Vitals and nursing note reviewed.   Constitutional:      General: She is not in acute distress.    Appearance: Normal appearance. She is not diaphoretic.  HENT:     Head: Normocephalic.     Mouth/Throat:     Mouth: Mucous membranes are moist.  Cardiovascular:     Rate and Rhythm: Normal rate.  Pulmonary:     Effort: Pulmonary effort is normal. No respiratory distress.  Abdominal:     Palpations: Abdomen is soft.     Tenderness: There is no abdominal tenderness. There is no guarding or rebound.  Musculoskeletal:     Comments: Mild tenderness to palpation of the midline lower lumbar spine, upper extremities are neurovascularly intact, she complains of a decrease sensation in the bilateral palmar aspects of her hands involving all 5 fingers, worse on the left, no decreased grip strength or drift  Skin:    General: Skin is warm.  Neurological:     Mental Status: She is alert and oriented to person, place, and time. Mental status is at baseline.  Psychiatric:        Mood and Affect: Mood normal.    ED Results / Procedures / Treatments   Labs (all labs ordered are listed, but only abnormal results are displayed) Labs Reviewed  BASIC METABOLIC PANEL  CBC  URINALYSIS, ROUTINE W REFLEX MICROSCOPIC    EKG None  Radiology No results found.  Procedures Procedures    Medications Ordered in ED Medications - No data to display  ED Course/ Medical Decision Making/ A&P                           Medical Decision Making Amount and/or Complexity of Data Reviewed Labs: ordered. Radiology: ordered.  Risk Prescription drug management. Decision regarding hospitalization.   Nine 53-year-old female presents emergency department back pain, bilateral upper extremity acute on chronic numbness, intermittent nonspecific abdominal pain, weakness.  Blood work shows an acute hyponatremia of 122, FLU COV negative, baseline anemia, UA shows no UTI. CT imaging shows an unremarkable CT head, CT of the lumbar spine catches a lower  thoracic compression fracture.  We are pending further x-rays but anticipate admission based off of the hyponatremia, degree of pain level and age/comorbidities.  Patient signed out to Dr. Pilar Plate pending imaging.        Final Clinical Impression(s) / ED Diagnoses Final diagnoses:  None    Rx / DC Orders ED Discharge Orders     None         Rozelle Logan, DO 03/11/21 1829

## 2021-03-10 NOTE — ED Triage Notes (Signed)
Pt comes from [facility] assisted living for c/o of back pain and bilateral hand numbness after a fall two weeks ago. Pt now having dizziness and nausea

## 2021-03-10 NOTE — ED Provider Notes (Signed)
°  Provider Note MRN:  [id number]  Arrival date & time: 03/11/21    ED Course and Medical Decision Making  Assumed care from Dr. Wilkie Aye at shift change.  Nonspecific complaints generally chronic in nature, abdominal pain, paresthesias to the arms, back pain.  Work-up revealing a compression fracture, hyponatremia.  Will admit for sodium correction.  Procedures  Final Clinical Impressions(s) / ED Diagnoses     ICD-10-CM   1. Hyponatremia  E87.1 DG CHEST PORT 1 VIEW    DG Abd 1 View    DG CHEST PORT 1 VIEW    DG Abd 1 View    2. Abdominal pain  R10.9 DG CHEST PORT 1 VIEW    DG Abd 1 View    DG CHEST PORT 1 VIEW    DG Abd 1 View      ED Discharge Orders     None       Discharge Instructions   None     Elmer Sow. Pilar Plate, MD [facility] Medicine [facility] [email or web address]    Sabas Sous, MD 03/11/21 [phone]

## 2021-03-10 NOTE — ED Notes (Signed)
Pt states that she is also having numbness in her hands that started today, L hand is worse, unsure what time it started today, L hand is weaker, reports hx of stroke with L sided deficits.

## 2021-03-11 ENCOUNTER — Encounter (HOSPITAL_COMMUNITY): Payer: Self-pay | Admitting: Internal Medicine

## 2021-03-11 ENCOUNTER — Inpatient Hospital Stay (HOSPITAL_COMMUNITY): Payer: Medicare Other

## 2021-03-11 DIAGNOSIS — R531 Weakness: Secondary | ICD-10-CM | POA: Diagnosis not present

## 2021-03-11 DIAGNOSIS — S22080A Wedge compression fracture of T11-T12 vertebra, initial encounter for closed fracture: Secondary | ICD-10-CM

## 2021-03-11 DIAGNOSIS — E861 Hypovolemia: Secondary | ICD-10-CM | POA: Diagnosis present

## 2021-03-11 DIAGNOSIS — Z888 Allergy status to other drugs, medicaments and biological substances status: Secondary | ICD-10-CM | POA: Diagnosis not present

## 2021-03-11 DIAGNOSIS — I130 Hypertensive heart and chronic kidney disease with heart failure and stage 1 through stage 4 chronic kidney disease, or unspecified chronic kidney disease: Secondary | ICD-10-CM | POA: Diagnosis present

## 2021-03-11 DIAGNOSIS — R1084 Generalized abdominal pain: Secondary | ICD-10-CM

## 2021-03-11 DIAGNOSIS — Z880 Allergy status to penicillin: Secondary | ICD-10-CM | POA: Diagnosis not present

## 2021-03-11 DIAGNOSIS — E871 Hypo-osmolality and hyponatremia: Secondary | ICD-10-CM | POA: Diagnosis present

## 2021-03-11 DIAGNOSIS — M549 Dorsalgia, unspecified: Secondary | ICD-10-CM | POA: Diagnosis present

## 2021-03-11 DIAGNOSIS — I959 Hypotension, unspecified: Secondary | ICD-10-CM | POA: Diagnosis present

## 2021-03-11 DIAGNOSIS — E860 Dehydration: Secondary | ICD-10-CM | POA: Diagnosis present

## 2021-03-11 DIAGNOSIS — Z66 Do not resuscitate: Secondary | ICD-10-CM | POA: Diagnosis not present

## 2021-03-11 DIAGNOSIS — M4854XA Collapsed vertebra, not elsewhere classified, thoracic region, initial encounter for fracture: Secondary | ICD-10-CM | POA: Diagnosis present

## 2021-03-11 DIAGNOSIS — E785 Hyperlipidemia, unspecified: Secondary | ICD-10-CM | POA: Diagnosis present

## 2021-03-11 DIAGNOSIS — E876 Hypokalemia: Secondary | ICD-10-CM | POA: Diagnosis present

## 2021-03-11 DIAGNOSIS — R296 Repeated falls: Secondary | ICD-10-CM | POA: Diagnosis present

## 2021-03-11 DIAGNOSIS — E869 Volume depletion, unspecified: Secondary | ICD-10-CM | POA: Diagnosis present

## 2021-03-11 DIAGNOSIS — N1831 Chronic kidney disease, stage 3a: Secondary | ICD-10-CM | POA: Diagnosis present

## 2021-03-11 DIAGNOSIS — E039 Hypothyroidism, unspecified: Secondary | ICD-10-CM | POA: Diagnosis present

## 2021-03-11 DIAGNOSIS — I10 Essential (primary) hypertension: Secondary | ICD-10-CM

## 2021-03-11 DIAGNOSIS — R109 Unspecified abdominal pain: Secondary | ICD-10-CM | POA: Diagnosis present

## 2021-03-11 DIAGNOSIS — Z79899 Other long term (current) drug therapy: Secondary | ICD-10-CM | POA: Diagnosis not present

## 2021-03-11 DIAGNOSIS — I69354 Hemiplegia and hemiparesis following cerebral infarction affecting left non-dominant side: Secondary | ICD-10-CM | POA: Diagnosis not present

## 2021-03-11 DIAGNOSIS — I5032 Chronic diastolic (congestive) heart failure: Secondary | ICD-10-CM | POA: Diagnosis present

## 2021-03-11 DIAGNOSIS — Z7982 Long term (current) use of aspirin: Secondary | ICD-10-CM | POA: Diagnosis not present

## 2021-03-11 DIAGNOSIS — Z79890 Hormone replacement therapy (postmenopausal): Secondary | ICD-10-CM | POA: Diagnosis not present

## 2021-03-11 DIAGNOSIS — Z20822 Contact with and (suspected) exposure to covid-19: Secondary | ICD-10-CM | POA: Diagnosis present

## 2021-03-11 DIAGNOSIS — E875 Hyperkalemia: Secondary | ICD-10-CM | POA: Diagnosis not present

## 2021-03-11 DIAGNOSIS — I4891 Unspecified atrial fibrillation: Secondary | ICD-10-CM | POA: Diagnosis not present

## 2021-03-11 DIAGNOSIS — Z885 Allergy status to narcotic agent status: Secondary | ICD-10-CM | POA: Diagnosis not present

## 2021-03-11 LAB — COMPREHENSIVE METABOLIC PANEL
ALT: 16 U/L (ref 0–44)
AST: 21 U/L (ref 15–41)
Albumin: 3.1 g/dL — ABNORMAL LOW (ref 3.5–5.0)
Alkaline Phosphatase: 98 U/L (ref 38–126)
Anion gap: 8 (ref 5–15)
BUN: 16 mg/dL (ref 8–23)
CO2: 29 mmol/L (ref 22–32)
Calcium: 8 mg/dL — ABNORMAL LOW (ref 8.9–10.3)
Chloride: 85 mmol/L — ABNORMAL LOW (ref 98–111)
Creatinine, Ser: 0.89 mg/dL (ref 0.44–1.00)
GFR, Estimated: 58 mL/min — ABNORMAL LOW (ref >60)
Glucose, Bld: 77 mg/dL (ref 70–99)
Potassium: 3.4 mmol/L — ABNORMAL LOW (ref 3.5–5.1)
Sodium: 122 mmol/L — ABNORMAL LOW (ref 135–145)
Total Bilirubin: 1.2 mg/dL (ref 0.3–1.2)
Total Protein: 5.6 g/dL — ABNORMAL LOW (ref 6.5–8.1)

## 2021-03-11 LAB — URINALYSIS, ROUTINE W REFLEX MICROSCOPIC
Bilirubin Urine: NEGATIVE
Glucose, UA: NEGATIVE mg/dL
Hgb urine dipstick: NEGATIVE
Ketones, ur: 20 mg/dL — AB
Leukocytes,Ua: NEGATIVE
Nitrite: NEGATIVE
Protein, ur: NEGATIVE mg/dL
Specific Gravity, Urine: 1.01 (ref 1.005–1.030)
pH: 7 (ref 5.0–8.0)

## 2021-03-11 LAB — HEPATIC FUNCTION PANEL
ALT: 17 U/L (ref 0–44)
AST: 21 U/L (ref 15–41)
Albumin: 3.5 g/dL (ref 3.5–5.0)
Alkaline Phosphatase: 107 U/L (ref 38–126)
Bilirubin, Direct: 0.3 mg/dL — ABNORMAL HIGH (ref 0.0–0.2)
Indirect Bilirubin: 0.7 mg/dL (ref 0.3–0.9)
Total Bilirubin: 1 mg/dL (ref 0.3–1.2)
Total Protein: 6.3 g/dL — ABNORMAL LOW (ref 6.5–8.1)

## 2021-03-11 LAB — CBC WITH DIFFERENTIAL/PLATELET
Abs Immature Granulocytes: 0.02 10*3/uL (ref 0.00–0.07)
Basophils Absolute: 0 10*3/uL (ref 0.0–0.1)
Basophils Relative: 0 %
Eosinophils Absolute: 0.1 10*3/uL (ref 0.0–0.5)
Eosinophils Relative: 1 %
HCT: 32.4 % — ABNORMAL LOW (ref 36.0–46.0)
Hemoglobin: 10.7 g/dL — ABNORMAL LOW (ref 12.0–15.0)
Immature Granulocytes: 0 %
Lymphocytes Relative: 16 %
Lymphs Abs: 1.2 10*3/uL (ref 0.7–4.0)
MCH: 31.2 pg (ref 26.0–34.0)
MCHC: 33 g/dL (ref 30.0–36.0)
MCV: 94.5 fL (ref 80.0–100.0)
Monocytes Absolute: 1.1 10*3/uL — ABNORMAL HIGH (ref 0.1–1.0)
Monocytes Relative: 15 %
Neutro Abs: 5 10*3/uL (ref 1.7–7.7)
Neutrophils Relative %: 68 %
Platelets: 204 10*3/uL (ref 150–400)
RBC: 3.43 MIL/uL — ABNORMAL LOW (ref 3.87–5.11)
RDW: 13 % (ref 11.5–15.5)
WBC: 7.4 10*3/uL (ref 4.0–10.5)
nRBC: 0 % (ref 0.0–0.2)

## 2021-03-11 LAB — MAGNESIUM: Magnesium: 1.9 mg/dL (ref 1.7–2.4)

## 2021-03-11 LAB — RESP PANEL BY RT-PCR (FLU A&B, COVID) ARPGX2
Influenza A by PCR: NEGATIVE
Influenza B by PCR: NEGATIVE
SARS Coronavirus 2 by RT PCR: NEGATIVE

## 2021-03-11 LAB — SODIUM, URINE, RANDOM: Sodium, Ur: 57 mmol/L

## 2021-03-11 LAB — PHOSPHORUS: Phosphorus: 3.1 mg/dL (ref 2.5–4.6)

## 2021-03-11 LAB — CORTISOL-AM, BLOOD: Cortisol - AM: 11.6 ug/dL (ref 6.7–22.6)

## 2021-03-11 LAB — OSMOLALITY: Osmolality: 255 mOsm/kg — ABNORMAL LOW (ref 275–295)

## 2021-03-11 LAB — CREATININE, URINE, RANDOM: Creatinine, Urine: 66.46 mg/dL

## 2021-03-11 LAB — TSH: TSH: 3.506 u[IU]/mL (ref 0.350–4.500)

## 2021-03-11 LAB — CK: Total CK: 31 U/L — ABNORMAL LOW (ref 38–234)

## 2021-03-11 LAB — OSMOLALITY, URINE: Osmolality, Ur: 349 mOsm/kg (ref 300–900)

## 2021-03-11 MED ORDER — SODIUM CHLORIDE 0.9 % IV SOLN: INTRAVENOUS | Status: DC

## 2021-03-11 MED ORDER — TRAMADOL HCL 50 MG PO TABS
25.0000 mg | ORAL_TABLET | Freq: Three times a day (TID) | ORAL | Status: DC | PRN
  Administered 2021-03-11: 25 mg via ORAL
  Filled 2021-03-11: qty 1

## 2021-03-11 MED ORDER — TRAMADOL HCL 50 MG PO TABS
25.0000 mg | ORAL_TABLET | Freq: Three times a day (TID) | ORAL | Status: DC | PRN
  Administered 2021-03-11 – 2021-03-16 (×3): 25 mg via ORAL
  Filled 2021-03-11 (×3): qty 1

## 2021-03-11 MED ORDER — IOHEXOL 300 MG/ML  SOLN
100.0000 mL | Freq: Once | INTRAMUSCULAR | Status: AC | PRN
  Administered 2021-03-11: 75 mL via INTRAVENOUS

## 2021-03-11 MED ORDER — POTASSIUM CHLORIDE 10 MEQ/100ML IV SOLN
10.0000 meq | INTRAVENOUS | Status: AC
  Administered 2021-03-11 (×2): 10 meq via INTRAVENOUS
  Filled 2021-03-11 (×2): qty 100

## 2021-03-11 MED ORDER — POLYETHYLENE GLYCOL 3350 17 G PO PACK: 17.0000 g | PACK | Freq: Every day | ORAL | Status: DC | PRN

## 2021-03-11 MED ORDER — POTASSIUM CHLORIDE CRYS ER 20 MEQ PO TBCR
20.0000 meq | EXTENDED_RELEASE_TABLET | Freq: Two times a day (BID) | ORAL | Status: DC
  Administered 2021-03-11 – 2021-03-13 (×4): 20 meq via ORAL
  Filled 2021-03-11 (×5): qty 1

## 2021-03-11 MED ORDER — TRAZODONE HCL 50 MG PO TABS
25.0000 mg | ORAL_TABLET | Freq: Every day | ORAL | Status: DC
  Administered 2021-03-11 – 2021-03-15 (×4): 25 mg via ORAL
  Filled 2021-03-11 (×6): qty 1

## 2021-03-11 MED ORDER — SENNA 8.6 MG PO TABS: 2.0000 | ORAL_TABLET | Freq: Every day | ORAL | Status: DC | PRN

## 2021-03-11 MED ORDER — PRAVASTATIN SODIUM 40 MG PO TABS
40.0000 mg | ORAL_TABLET | Freq: Every evening | ORAL | Status: DC
  Administered 2021-03-12 – 2021-03-15 (×3): 40 mg via ORAL
  Filled 2021-03-11 (×3): qty 2
  Filled 2021-03-11: qty 1
  Filled 2021-03-11: qty 2
  Filled 2021-03-11: qty 1

## 2021-03-11 MED ORDER — SODIUM CHLORIDE (PF) 0.9 % IJ SOLN
INTRAMUSCULAR | Status: AC
  Filled 2021-03-11: qty 50

## 2021-03-11 MED ORDER — MECLIZINE HCL 25 MG PO TABS
12.5000 mg | ORAL_TABLET | Freq: Four times a day (QID) | ORAL | Status: DC | PRN
  Administered 2021-03-11 (×2): 25 mg via ORAL
  Filled 2021-03-11 (×2): qty 1

## 2021-03-11 MED ORDER — ACETAMINOPHEN 650 MG RE SUPP: 650.0000 mg | Freq: Four times a day (QID) | RECTAL | Status: DC | PRN

## 2021-03-11 MED ORDER — SENNA 8.6 MG PO TABS
2.0000 | ORAL_TABLET | Freq: Every day | ORAL | Status: DC
  Administered 2021-03-12 – 2021-03-16 (×4): 17.2 mg via ORAL
  Filled 2021-03-11 (×6): qty 2

## 2021-03-11 MED ORDER — LEVOTHYROXINE SODIUM 50 MCG PO TABS
50.0000 ug | ORAL_TABLET | Freq: Every day | ORAL | Status: DC
  Administered 2021-03-11 – 2021-03-12 (×2): 50 ug via ORAL
  Filled 2021-03-11 (×2): qty 1

## 2021-03-11 MED ORDER — ASPIRIN EC 81 MG PO TBEC
81.0000 mg | DELAYED_RELEASE_TABLET | Freq: Every day | ORAL | Status: DC
  Administered 2021-03-11 – 2021-03-16 (×5): 81 mg via ORAL
  Filled 2021-03-11 (×6): qty 1

## 2021-03-11 MED ORDER — MECLIZINE HCL 25 MG PO TABS
12.5000 mg | ORAL_TABLET | Freq: Two times a day (BID) | ORAL | Status: DC | PRN
  Administered 2021-03-11: 12.5 mg via ORAL
  Filled 2021-03-11: qty 1

## 2021-03-11 MED ORDER — ACETAMINOPHEN 325 MG PO TABS
650.0000 mg | ORAL_TABLET | Freq: Four times a day (QID) | ORAL | Status: DC | PRN
  Administered 2021-03-12 – 2021-03-16 (×2): 650 mg via ORAL
  Filled 2021-03-11 (×3): qty 2

## 2021-03-11 MED ORDER — SODIUM CHLORIDE 0.9 % IV SOLN
75.0000 mL/h | INTRAVENOUS | Status: DC
  Administered 2021-03-11: 75 mL/h via INTRAVENOUS

## 2021-03-11 MED ORDER — IOHEXOL 9 MG/ML PO SOLN
1000.0000 mL | ORAL | Status: AC
  Administered 2021-03-11: 1000 mL via ORAL

## 2021-03-11 MED ORDER — HYDROCODONE-ACETAMINOPHEN 5-325 MG PO TABS
1.0000 | ORAL_TABLET | ORAL | Status: DC | PRN
  Filled 2021-03-11: qty 1

## 2021-03-11 MED ORDER — BUPROPION HCL 75 MG PO TABS
75.0000 mg | ORAL_TABLET | Freq: Every day | ORAL | Status: DC
Start: 2021-03-11 — End: 2021-03-16
  Administered 2021-03-11 – 2021-03-16 (×5): 75 mg via ORAL
  Filled 2021-03-11 (×6): qty 1

## 2021-03-11 MED ORDER — CALCITONIN (SALMON) 200 UNIT/ACT NA SOLN
1.0000 | Freq: Every day | NASAL | Status: DC
  Administered 2021-03-12: 11:00:00 1 via NASAL
  Filled 2021-03-11: qty 3.7

## 2021-03-11 NOTE — Progress Notes (Addendum)
Candace Perkins  [id number] DOB: 25-Sep-1920 DOA: 03/11/2021 PCP: Cari Caraway, MD    Brief Narrative:  [phone] with a history of hypothyroidism, chronic hyponatremia, CVA, HTN, and vertigo who presented to the ED from her ALF with complaints of back pain and bilateral hand numbness following a fall 2 weeks prior.  Evaluation in the ER revealed a T11 compression fracture.  She was also found to have a sodium of 122, compared to a baseline of approximately 131.  Consultants:  None  Code Status: NO CODE BLUE  Antimicrobials:  None  DVT prophylaxis: SCDs  Interim Hx: The patient was examined and interviewed by one of my partners earlier today.  Assessment & Plan:  Acute on chronic hyponatremia Review of records suggests baseline sodium for this patient over the last 2 years has been 130-133 - acute drop likely due to simple volume depletion and use of diuretic - monitor   Possible left-sided weakness Reportedly has chronic left-sided weakness due to prior CVA - at admission patient was not clear but suggested that this could have worsened lately - MRI brain this admit w/o acute findings - this is likely related to her hyponatremia   CKD stage IIIa Baseline creatinine 1.1  HLD  HTN  Hypothyroidism  History of CVA   Family Communication: No family present at time of exam Disposition:   Objective: Blood pressure (!) 159/70, pulse 83, temperature (!) 97.4 F (36.3 C), temperature source Oral, resp. rate 16, height 5\' 1"  (1.549 m), weight 47.6 kg, SpO2 96 %.  Intake/Output Summary (Last 24 hours) at 03/11/2021 0956 Last data filed at 03/11/2021 [id number] Gross per 24 hour  Intake 102.67 ml  Output 200 ml  Net -97.33 ml   Filed Weights   03/11/21 0128  Weight: 47.6 kg    Examination: Follow-up exam completed   CBC: Recent Labs  Lab 03/05/2021 1928 03/11/21 0521  WBC 8.8 7.4  NEUTROABS  --  5.0  HGB 11.5* 10.7*  HCT 34.1* 32.4*  MCV 93.4 94.5  PLT 284 0000000    Basic Metabolic Panel: Recent Labs  Lab 02/20/2021 1928 03/11/21 0521  NA 122* 122*  K 3.4* 3.4*  CL 81* 85*  CO2 30 29  GLUCOSE 96 77  BUN 18 16  CREATININE 1.02* 0.89  CALCIUM 8.5* 8.0*  MG  --  1.9  PHOS  --  3.1   GFR: Estimated Creatinine Clearance: 25.9 mL/min (by C-G formula based on SCr of 0.89 mg/dL).  Liver Function Tests: Recent Labs  Lab 02/23/2021 1928 03/11/21 0521  AST 21 21  ALT 17 16  ALKPHOS 107 98  BILITOT 1.0 1.2  PROT 6.3* 5.6*  ALBUMIN 3.5 3.1*    HbA1C: Hgb A1c MFr Bld  Date/Time Value Ref Range Status  05/14/2012 05:12 AM 5.6 <5.7 % Final    Comment:    (NOTE)                                                                       According to the ADA Clinical Practice Recommendations for 2011, when HbA1c is used as a screening test:  >=6.5%   Diagnostic of Diabetes Mellitus           (if abnormal  result is confirmed) 5.7-6.4%   Increased risk of developing Diabetes Mellitus References:Diagnosis and Classification of Diabetes Mellitus,Diabetes Care,2011,34(Suppl 1):S62-S69 and Standards of Medical Care in         Diabetes - 2011,Diabetes [id number] (Suppl 1):S11-S61.  04/28/2009 09:00 AM  4.6 - 6.1 % Final   5.6 (NOTE) The ADA recommends the following therapeutic goal for glycemic control related to Hgb A1c measurement: Goal of therapy: <6.5 Hgb A1c  Reference: American Diabetes Association: Clinical Practice Recommendations 2010, Diabetes Care, 2010, 33: (Suppl  1).    Recent Results (from the past 240 hour(s))  Resp Panel by RT-PCR (Flu A&B, Covid) Nasopharyngeal Swab     Status: None   Collection Time: 03/11/21  1:00 AM   Specimen: Nasopharyngeal Swab; Nasopharyngeal(NP) swabs in vial transport medium  Result Value Ref Range Status   SARS Coronavirus 2 by RT PCR NEGATIVE NEGATIVE Final    Comment: (NOTE) SARS-CoV-2 target nucleic acids are NOT DETECTED.  The SARS-CoV-2 RNA is generally detectable in upper respiratory specimens  during the acute phase of infection. The lowest concentration of SARS-CoV-2 viral copies this assay can detect is 138 copies/mL. A negative result does not preclude SARS-Cov-2 infection and should not be used as the sole basis for treatment or other patient management decisions. A negative result may occur with  improper specimen collection/handling, submission of specimen other than nasopharyngeal swab, presence of viral mutation(s) within the areas targeted by this assay, and inadequate number of viral copies(<138 copies/mL). A negative result must be combined with clinical observations, patient history, and epidemiological information. The expected result is Negative.  Fact Sheet for Patients:  EntrepreneurPulse.com.au  Fact Sheet for Healthcare Providers:  IncredibleEmployment.be  This test is no t yet approved or cleared by the [place] FDA and  has been authorized for detection and/or diagnosis of SARS-CoV-2 by FDA under an Emergency Use Authorization (EUA). This EUA will remain  in effect (meaning this test can be used) for the duration of the COVID-19 declaration under Section 564(b)(1) of the Act, 21 U.S.C.section 360bbb-3(b)(1), unless the authorization is terminated  or revoked sooner.       Influenza A by PCR NEGATIVE NEGATIVE Final   Influenza B by PCR NEGATIVE NEGATIVE Final    Comment: (NOTE) The Xpert Xpress SARS-CoV-2/FLU/RSV plus assay is intended as an aid in the diagnosis of influenza from Nasopharyngeal swab specimens and should not be used as a sole basis for treatment. Nasal washings and aspirates are unacceptable for Xpert Xpress SARS-CoV-2/FLU/RSV testing.  Fact Sheet for Patients: EntrepreneurPulse.com.au  Fact Sheet for Healthcare Providers: IncredibleEmployment.be  This test is not yet approved or cleared by the [place] FDA and has been authorized for detection  and/or diagnosis of SARS-CoV-2 by FDA under an Emergency Use Authorization (EUA). This EUA will remain in effect (meaning this test can be used) for the duration of the COVID-19 declaration under Section 564(b)(1) of the Act, 21 U.S.C. section 360bbb-3(b)(1), unless the authorization is terminated or revoked.  Performed at [facility], [place] [address] [place], [place] [address]      Scheduled Meds:  aspirin EC  81 mg Oral Daily   buPROPion  75 mg Oral Daily   levothyroxine  50 mcg Oral Q0600   pravastatin  40 mg Oral QPM   sodium chloride (PF)       traZODone  25 mg Oral QHS   Continuous Infusions:  sodium chloride 75 mL/hr (03/11/21 0343)     LOS: 0  days   Cherene Altes, MD [facility] Office  [phone] Pager - Text Page per Amion  If 7PM-7AM, please contact night-coverage per Amion 03/11/2021, 9:56 AM

## 2021-03-11 NOTE — Evaluation (Signed)
Occupational Therapy Evaluation Patient Details Name: Candace Perkins MRN: [id number] DOB: 1920-04-12 Today's Date: 03/11/2021   History of Present Illness 88 y.o. female with medical history significant of hypothyroidism, vertigo, history of stroke, hypertension and admitted 02/20/2021 for hyponatremia and back pain   Clinical Impression   Chart reviewed, nurse cleared pt for participation in evaluation. Pt is alert and oriented x4.Prior to hospital admission, pt required assist for ADL/IADL and lived in ALF. Currently pt demonstrates impairments as described below (See OT problem list) which functionally limit herto perform ADL/self-care tasks. Pt currently requires MAX A for rolling, MAX A for LB bathing/toileting. Pt declines any additional mobility or OOB activity reporting dizziness however BP is WNL.  Education provided re: importance of OOB mobility to decrease functional decline. PTA pt reports she ambulated with a rollator. Pt would benefit from skilled OT services to address noted impairments and functional limitations (see below for any additional details) in order to maximize safety and independence while minimizing falls risk and caregiver burden. Upon hospital discharge, recommend STR to maximize pt safety and return to PLOF.       Recommendations for follow up therapy are one component of a multi-disciplinary discharge planning process, led by the attending physician.  Recommendations may be updated based on patient status, additional functional criteria and insurance authorization.   Follow Up Recommendations  Skilled nursing-short term rehab (<3 hours/day)    Assistance Recommended at Discharge Frequent or constant Supervision/Assistance  Patient can return home with the following A lot of help with walking and/or transfers;A lot of help with bathing/dressing/bathroom;Assist for transportation    Functional Status Assessment  Patient has had a recent decline in their functional  status and demonstrates the ability to make significant improvements in function in a reasonable and predictable amount of time.  Equipment Recommendations  Other (comment) (per next venue of care)    Recommendations for Other Services       Precautions / Restrictions Precautions Precautions: Fall Precaution Comments: T11 compression fx Restrictions Weight Bearing Restrictions: No      Mobility Bed Mobility Overal bed mobility: Needs Assistance Bed Mobility: Rolling Rolling: Max assist              Transfers                          Balance Overall balance assessment: History of Falls                                         ADL either performed or assessed with clinical judgement   ADL Overall ADL's : Needs assistance/impaired     Grooming: Wash/dry hands;Wash/dry face;Minimal assistance;Bed level Grooming Details (indicate cue type and reason): pt declined OOB activity     Lower Body Bathing: Maximal assistance;Bed level       Lower Body Dressing: Maximal assistance;Bed level       Toileting- Clothing Manipulation and Hygiene: Total assistance;Bed level       Functional mobility during ADLs: Maximal assistance (for rolling) General ADL Comments: Pt reports feeling dizzy- declines out of bed activity; BP is 158/66 in supine; all tasks completed bed level     Vision   Additional Comments: will further assess pt reports no visual deficits     Perception     Praxis      Pertinent Vitals/Pain Pain Assessment  Pain Assessment: Faces Faces Pain Scale: Hurts little more Pain Location: back Pain Descriptors / Indicators: Discomfort Pain Intervention(s): Limited activity within patient's tolerance, Repositioned, Monitored during session     Hand Dominance     Extremity/Trunk Assessment Upper Extremity Assessment Upper Extremity Assessment: Generalized weakness;LUE deficits/detail LUE Deficits / Details: weaknss noted  throughout LUE- unable to fully assess due to pt perseverating over pure wik feeling wet, requiring additonial repositioning in the bed   Lower Extremity Assessment Lower Extremity Assessment: Generalized weakness LLE Deficits / Details: residual deficits from previous CVA; pt able to place both feet on bed (knee in flexion) in attempt to self reposition       Communication Communication Communication: No difficulties   Cognition Arousal/Alertness: Awake/alert Behavior During Therapy: WFL for tasks assessed/performed Overall Cognitive Status: No family/caregiver present to determine baseline cognitive functioning                                 General Comments: pt is oriented to self, date, place, situation     General Comments       Exercises Other Exercises Other Exercises: education re: role of OT/role of rehab, importance of oob mobility, safe ADL completion; will benefit from further education   Shoulder Instructions      Home Living Family/patient expects to be discharged to:: Assisted living                                 Additional Comments: pt reports she has help for all ADL/iADL      Prior Functioning/Environment Prior Level of Function : Needs assist             Mobility Comments: pt reports she walks with a rollator, no wc use; was walking with a rollator prior to fall at facility ADLs Comments: pt reports she takes a "bird bath" as she is no longer able to get in the shower, assist with dressing as needed.        OT Problem List: Decreased strength;Decreased activity tolerance      OT Treatment/Interventions: Self-care/ADL training;Therapeutic exercise;Patient/family education;Therapeutic activities;DME and/or AE instruction    OT Goals(Current goals can be found in the care plan section) Acute Rehab OT Goals Patient Stated Goal: to move up in the bed OT Goal Formulation: With patient Time For Goal Achievement:  03/25/21 ADL Goals Pt Will Perform Grooming: sitting;with supervision Pt Will Perform Upper Body Dressing: with supervision;sitting Pt Will Transfer to Toilet: with min assist Pt Will Perform Toileting - Clothing Manipulation and hygiene: with min assist  OT Frequency: Min 3X/week    Co-evaluation              AM-PAC OT "6 Clicks" Daily Activity     Outcome Measure Help from another person eating meals?: A Little Help from another person taking care of personal grooming?: A Lot Help from another person toileting, which includes using toliet, bedpan, or urinal?: Total Help from another person bathing (including washing, rinsing, drying)?: A Lot Help from another person to put on and taking off regular upper body clothing?: Total Help from another person to put on and taking off regular lower body clothing?: A Lot 6 Click Score: 11   End of Session Nurse Communication: Mobility status (small opening on L arm prior to session starting requiring bandage)  Activity Tolerance: Other (comment) (Decreased tolerance  for therapy due to pt reported dizziness due to vertigo) Patient left: in bed  OT Visit Diagnosis: Muscle weakness (generalized) (M62.81);History of falling (Z91.81)                Time: [id number] OT Time Calculation (min): 27 min Charges:  OT General Charges $OT Visit: 1 Visit OT Evaluation $OT Eval Low Complexity: 1 Low OT Treatments $Self Care/Home Management : 8-22 mins  Shanon Payor, OTD OTR/L  03/11/21, 3:41 PM

## 2021-03-11 NOTE — Assessment & Plan Note (Signed)
Unclear duration, pt has chronic left side weakness from prior CVA but she states it is worse Will order MRI and if positive will need to discuss with neurology

## 2021-03-11 NOTE — Assessment & Plan Note (Signed)
unclear etiology  KUB showing bowel displacement rec CT will order

## 2021-03-11 NOTE — ED Notes (Signed)
Per floor patient cant come up until covid has resulted

## 2021-03-11 NOTE — Assessment & Plan Note (Signed)
-   Check TSH continue home medications at current dose ° °

## 2021-03-11 NOTE — Subjective & Objective (Signed)
Patient lives in assisted living started to complain about back pain and bilateral hand numbness after she had a fall about 2 weeks ago.  Also reports dizziness and nausea. She is concerned that her left hand is doing worse than the right. Patient has history of frequent fall he is taking aspirin.  Has history of stroke with left-sided deficits in the past. Longstanding history of vertigo for which she is taking Antivert History of hyponatremia at baseline sodium running between 130-133

## 2021-03-11 NOTE — Evaluation (Signed)
Physical Therapy Evaluation Patient Details Name: Candace Perkins MRN: [id number] DOB: May 31, 1920 Today's Date: 03/11/2021  History of Present Illness  88 y.o. female with medical history significant of hypothyroidism, vertigo, history of stroke, hypertension and admitted 03/05/2021 for hyponatremia and back pain  Clinical Impression  Pt admitted with above diagnosis. Pt currently with functional limitations due to the deficits listed below (see PT Problem List). Pt will benefit from skilled PT to increase their independence and safety with mobility to allow discharge to the venue listed below.  Pt reports she was ambulatory prior to fall at her facility.  Pt stated she was unable to stand or move OOB today.  Pt assisted with rolling and repositioning in bed and requiring increased assist.  Pt states "they're supposed to start therapy with me" at her facility.  If ALF unable to provide physical assist, pt may need SNF.      Recommendations for follow up therapy are one component of a multi-disciplinary discharge planning process, led by the attending physician.  Recommendations may be updated based on patient status, additional functional criteria and insurance authorization.  Follow Up Recommendations Skilled nursing-short term rehab (<3 hours/day) (vs return to ALF with HHPT, depending on assist ALF can provide)    Assistance Recommended at Discharge Frequent or constant Supervision/Assistance  Patient can return home with the following  Two people to help with walking and/or transfers;Two people to help with bathing/dressing/bathroom    Equipment Recommendations None recommended by PT  Recommendations for Other Services       Functional Status Assessment Patient has had a recent decline in their functional status and demonstrates the ability to make significant improvements in function in a reasonable and predictable amount of time.     Precautions / Restrictions Precautions Precautions:  Fall Precaution Comments: T11 compression fx      Mobility  Bed Mobility Overal bed mobility: Needs Assistance Bed Mobility: Rolling Rolling: Total assist         General bed mobility comments: pt able to assist a little with rolling to left however total assist for rolling to right; utilized bed pad and bed positioning to reposition pt    Transfers                   General transfer comment: pt stated she was unable to get OOB today    Ambulation/Gait                  Stairs            Wheelchair Mobility    Modified Rankin (Stroke Patients Only)       Balance Overall balance assessment: History of Falls (fall at facility just prior to admission per pt and chart review)                                           Pertinent Vitals/Pain Pain Assessment Pain Assessment: Faces Faces Pain Scale: Hurts little more Pain Location: back Pain Intervention(s): Repositioned, Monitored during session    Home Living Family/patient expects to be discharged to:: Assisted living                        Prior Function Prior Level of Function : Needs assist             Mobility Comments: reports needing assist however  also states she was walking prior to her fall at facility; ambulatory with RW per chart review       Hand Dominance        Extremity/Trunk Assessment        Lower Extremity Assessment Lower Extremity Assessment: Generalized weakness;LLE deficits/detail LLE Deficits / Details: residual deficits from previous CVA; pt able to place both feet on bed (knee in flexion) in attempt to self reposition       Communication   Communication: No difficulties  Cognition Arousal/Alertness: Awake/alert Behavior During Therapy: WFL for tasks assessed/performed Overall Cognitive Status: No family/caregiver present to determine baseline cognitive functioning                                 General  Comments: perseverating on feeling wet (purewick) and not being up high enough in bed (despite being placed in correct position)        General Comments      Exercises     Assessment/Plan    PT Assessment Patient needs continued PT services  PT Problem List         PT Treatment Interventions Therapeutic exercise;Gait training;DME instruction;Functional mobility training;Therapeutic activities;Patient/family education;Balance training;Wheelchair mobility training    PT Goals (Current goals can be found in the Care Plan section)  Acute Rehab PT Goals PT Goal Formulation: With patient Time For Goal Achievement: 03/25/21 Potential to Achieve Goals: Fair    Frequency Min 2X/week     Co-evaluation               AM-PAC PT "6 Clicks" Mobility  Outcome Measure Help needed turning from your back to your side while in a flat bed without using bedrails?: Total Help needed moving from lying on your back to sitting on the side of a flat bed without using bedrails?: Total Help needed moving to and from a bed to a chair (including a wheelchair)?: Total Help needed standing up from a chair using your arms (e.g., wheelchair or bedside chair)?: Total Help needed to walk in hospital room?: Total Help needed climbing 3-5 steps with a railing? : Total 6 Click Score: 6    End of Session   Activity Tolerance: Patient limited by fatigue Patient left: with bed alarm set;in bed;with call bell/phone within reach Nurse Communication: Mobility status PT Visit Diagnosis: Muscle weakness (generalized) (M62.81);Difficulty in walking, not elsewhere classified (R26.2)    Time: [id number] PT Time Calculation (min) (ACUTE ONLY): 15 min   Charges:   PT Evaluation $PT Eval Low Complexity: 1 Low         Kati PT, DPT Acute Rehabilitation Services Pager: [phone] Office: [phone]   Myrtis Hopping [place] 03/11/2021, 12:33 PM

## 2021-03-11 NOTE — Plan of Care (Signed)
°  Problem: Clinical Measurements: Goal: Will remain free from infection Outcome: Progressing   Problem: Safety: Goal: Ability to remain free from injury will improve Outcome: Progressing   Problem: Skin Integrity: Goal: Risk for impaired skin integrity will decrease Outcome: Progressing   Problem: Activity: Goal: Risk for activity intolerance will decrease Outcome: Not Progressing   Problem: Nutrition: Goal: Adequate nutrition will be maintained Outcome: Not Progressing   Problem: Pain Managment: Goal: General experience of comfort will improve Outcome: Not Progressing

## 2021-03-11 NOTE — H&P (Signed)
Candace Perkins [id number] DOB: [birth year] DOA: 03/19/2021     PCP: Cari Caraway, MD   Outpatient Specialists:   NONE    Patient arrived to ER on 02/27/2021 at 1906 Referred by Attending Toy Baker, MD   Patient coming from:   From facility  assisted living  Chief Complaint:   Chief Complaint  Patient presents with   Back Pain    HPI: Candace Perkins is a 88 y.o. female with medical history significant of hypothyroidism, vertigo, history of stroke, hypertension    Presented with   back and abd pain Patient lives in assisted living started to complain about back pain and bilateral hand numbness after she had a fall about 2 weeks ago.  Also reports dizziness and nausea. She is concerned that her left hand is doing worse than the right. Patient has history of frequent fall he is taking aspirin.  Has history of stroke with left-sided deficits in the past. Longstanding history of vertigo for which she is taking Antivert History of hyponatremia at baseline sodium running between 130-133 When questioned father patient states that her hands feel stiff but she does not assist that the left hand feels worse she is unsure whether the change occur   Initial COVID TEST   in house  PCR testing  Pending  No results found for: SARSCOV2NAA   Regarding pertinent Chronic problems:     Hyperlipidemia -  on statins pravachol Lipid Panel     Component Value Date/Time   CHOL 166 05/14/2012 0512   TRIG 126 05/14/2012 0512   HDL 44 05/14/2012 0512   CHOLHDL 3.8 05/14/2012 0512   VLDL 25 05/14/2012 0512   LDLCALC 97 05/14/2012 0512     HTN on HCTZ, toprol   chronic CHF diastolic - last [id number]     Hypothyroidism:  Lab Results  Component Value Date   TSH  04/28/2009        on synthroid  Hx of CVA -  with/out residual deficits on Aspirin 81 mg,    CKD stage IIIa- baseline Cr 1.1 CrCl cannot be calculated (Unknown ideal weight.).  Lab Results  Component Value Date    CREATININE 1.02 (H) 02/20/2021   CREATININE 1.00 07/06/2020   CREATININE 1.13 (H) 08/07/2018    Chronic anemia - baseline hg Hemoglobin & Hematocrit  Recent Labs    07/06/20 1853 03/09/2021 1928  HGB 11.5* 11.5*     While in ER:   Showed indeterminate finding compression fracture Reports pain in the lower back CT lumbar spine did not show any evidence of new fractures there is a questionable compression fracture on T11 which is not where patient hurts    CT HEAD and neck NON acute  CXR -  NON acute, mild pleural effusion KUB - Ordered  CT lumbar Mild height loss with age-indeterminate lucency extending through the partially visualized inferior endplate of 624THL  Following Medications were ordered in ER: Medications  morphine 2 MG/ML injection 2 mg (2 mg Intravenous Given 03/15/2021 2210)  ondansetron (ZOFRAN-ODT) disintegrating tablet 4 mg (4 mg Oral Given 03/05/2021 2355)  meclizine (ANTIVERT) tablet 25 mg (25 mg Oral Given 03/09/2021 2355)       ED Triage Vitals  Enc Vitals Group     BP 02/21/2021 1930 (!) 163/61     Pulse Rate 03/17/2021 1930 76     Resp 02/21/2021 1930 (!) 26     Temp 03/15/2021 2028 98 F (36.7 C)  Temp Source 04/03/2021 2028 Oral     SpO2 04-03-2021 1915 98 %     Weight --      Height --      Head Circumference --      Peak Flow --      Pain Score 04/03/21 1918 10     Pain Loc --      Pain Edu? --      Excl. in GC? --   TMAX(24)@     _________________________________________ Significant initial  Findings: Abnormal Labs Reviewed  BASIC METABOLIC PANEL - Abnormal; Notable for the following components:      Result Value   Sodium 122 (*)    Potassium 3.4 (*)    Chloride 81 (*)    Creatinine, Ser 1.02 (*)    Calcium 8.5 (*)    GFR, Estimated 49 (*)    All other components within normal limits  CBC - Abnormal; Notable for the following components:   RBC 3.65 (*)    Hemoglobin 11.5 (*)    HCT 34.1 (*)    All other components within normal limits   HEPATIC FUNCTION PANEL - Abnormal; Notable for the following components:   Total Protein 6.3 (*)    Bilirubin, Direct 0.3 (*)    All other components within normal limits      ECG: Ordered Personally reviewed by me showing: HR : 75 Rhythm:  NSR,   no evidence of ischemic changes QTC 460     The recent clinical data is shown below. Vitals:   04-03-2021 2245 2021/04/03 2300 04/03/2021 2330 04-03-21 2345  BP: 129/61 (!) 119/54    Pulse: 71 64 80 72  Resp: 18 18    Temp:      TempSrc:      SpO2: 94% 94% 95% 95%     WBC     Component Value Date/Time   WBC 8.8 2021/04/03 1928   LYMPHSABS 1.1 08/07/2018 1555   MONOABS 1.2 (H) 08/07/2018 1555   EOSABS 0.0 08/07/2018 1555   BASOSABS 0.0 08/07/2018 1555        UA ordered   Urine analysis:    Component Value Date/Time   COLORURINE YELLOW 2021/04/03 1954   APPEARANCEUR CLEAR Apr 03, 2021 1954   LABSPEC 1.010 04-03-21 1954   PHURINE 7.0 04/03/21 1954   GLUCOSEU NEGATIVE April 03, 2021 1954   HGBUR NEGATIVE 04/03/2021 1954   BILIRUBINUR NEGATIVE 2021-04-03 1954   KETONESUR 20 (A) 04/03/21 1954   PROTEINUR NEGATIVE 04-03-2021 1954   UROBILINOGEN 0.2 05/13/2012 1521   NITRITE NEGATIVE 04-03-2021 1954   LEUKOCYTESUR NEGATIVE 2021/04/03 1954    No results found for this or any previous visit.   _____________________________________ Hospitalist was called for admission for  hyponatremia  The following Work up has been ordered so far:  Orders Placed This Encounter  Procedures   Resp Panel by RT-PCR (Flu A&B, Covid) Nasopharyngeal Swab   CT Head Wo Contrast   CT Lumbar Spine Wo Contrast   DG Thoracic Spine 2 View   Basic metabolic panel   CBC   Urinalysis, Routine w reflex microscopic   Hepatic function panel   CK   Differential   Magnesium   Phosphorus   Osmolality   Osmolality, urine   Sodium, urine, random   TSH   Creatinine, urine, random   Cortisol-am, blood   Cardiac monitoring   In and Out Cath    Consult to hospitalist   ED EKG   Admit to Inpatient (patient's expected  length of stay will be greater than 2 midnights or inpatient only procedure)     OTHER Significant initial  Findings:  labs showing:    Recent Labs  Lab 03/01/2021 1928  NA 122*  K 3.4*  CO2 30  GLUCOSE 96  BUN 18  CREATININE 1.02*  CALCIUM 8.5*    Cr   stable,    Lab Results  Component Value Date   CREATININE 1.02 (H) 03/13/2021   CREATININE 1.00 07/06/2020   CREATININE 1.13 (H) 08/07/2018    Recent Labs  Lab 03/09/2021 1928  AST 21  ALT 17  ALKPHOS 107  BILITOT 1.0  PROT 6.3*  ALBUMIN 3.5   Lab Results  Component Value Date   CALCIUM 8.5 (L) 03/19/2021    Plt: Lab Results  Component Value Date   PLT 284 02/20/2021       Recent Labs  Lab 03/09/2021 1928  WBC 8.8  HGB 11.5*  HCT 34.1*  MCV 93.4  PLT 284    HG/HCT  stable,       Component Value Date/Time   HGB 11.5 (L) 02/22/2021 1928   HCT 34.1 (L) 03/12/2021 1928   MCV 93.4 03/12/2021 1928           Cultures: No results found for: [place], [place], [place], REPTSTATUS   Radiological Exams on Admission: DG Thoracic Spine 2 View  Result Date: 02/17/2021 CLINICAL DATA:  Back pain EXAM: THORACIC SPINE 2 VIEWS COMPARISON:  CT 03/09/2021 FINDINGS: Scoliosis. Moderate compression fracture, appears to be at the level of T12 when counting from above on AP view. This would suggest transitional anatomy of the lumbar spine. IMPRESSION: Moderate age indeterminate compression fracture what appears to be T12 level. Suggest correlation with thoracic spine CT Electronically Signed   By: Donavan Foil M.D.   On: 03/05/2021 23:36   CT Head Wo Contrast  Result Date: 03/13/2021 CLINICAL DATA:  Neuro deficit, acute, stroke suspected. Fall 2 weeks ago. EXAM: CT HEAD WITHOUT CONTRAST TECHNIQUE: Contiguous axial images were obtained from the base of the skull through the vertex without intravenous contrast. RADIATION DOSE REDUCTION: This exam was  performed according to the departmental dose-optimization program which includes automated exposure control, adjustment of the mA and/or kV according to patient size and/or use of iterative reconstruction technique. COMPARISON:  02/26/2021 FINDINGS: Brain: Bilateral basal ganglia physiologic calcifications. There is atrophy and chronic small vessel disease changes. No acute intracranial abnormality. Specifically, no hemorrhage, hydrocephalus, mass lesion, acute infarction, or significant intracranial injury. Vascular: No hyperdense vessel or unexpected calcification. Skull: No acute calvarial abnormality. Sinuses/Orbits: Air-fluid level in the left maxillary sinus is stable since prior study. Other: None IMPRESSION: Atrophy, chronic microvascular disease. No acute intracranial abnormality. Electronically Signed   By: Rolm Baptise M.D.   On: 02/21/2021 22:48   CT Lumbar Spine Wo Contrast  Result Date: 03/02/2021 CLINICAL DATA:  Initial evaluation for acute low back pain, increased fracture risk. EXAM: CT LUMBAR SPINE WITHOUT CONTRAST TECHNIQUE: Multidetector CT imaging of the lumbar spine was performed without intravenous contrast administration. Multiplanar CT image reconstructions were also generated. RADIATION DOSE REDUCTION: This exam was performed according to the departmental dose-optimization program which includes automated exposure control, adjustment of the mA and/or kV according to patient size and/or use of iterative reconstruction technique. COMPARISON:  Prior radiograph from 02/26/2021. FINDINGS: Segmentation: Standard. Lowest well-formed disc space labeled the L5-S1 level. Alignment: Sigmoid scoliotic curvature of the visualized thoracolumbar spine. Trace retrolisthesis of T12 on L1 and L1 on  L2, with mild grade 1 anterolisthesis of L4 on L5 and L5 on S1. Findings chronic and facet mediated. Vertebrae: Mild height loss with age-indeterminate lucency seen extending through the partially visualized  inferior endplate of 624THL (series 8, image 28). Finding is incompletely assessed on this exam. Otherwise, vertebral body height maintained with no other acute or chronic fracture. Visualized sacrum and pelvis intact. No discrete or worrisome osseous lesions. Visualized lower ribs intact. Paraspinal and other soft tissues: Paraspinous soft tissues demonstrate no acute finding. Small layering bilateral pleural effusions partially visualized. Advanced aorto bi-iliac atherosclerotic disease. Disc levels: T12-L1: Degenerative intervertebral disc space narrowing with disc desiccation and mild disc bulge. Reactive endplate spurring. Mild facet hypertrophy. No stenosis. L1-2: Degenerative intervertebral disc space narrowing with diffuse disc bulge and disc desiccation. Reactive endplate spurring. Mild facet hypertrophy. No spinal stenosis. Foramina remain patent. L2-3: Degenerative intervertebral disc space narrowing with disc desiccation and diffuse disc bulge. Moderate left worse than right facet hypertrophy. Probable mild narrowing of the left lateral recess. Central canal remains patent. No significant foraminal encroachment. L3-4: Diffuse disc bulge with disc desiccation. Superimposed broad-based left foraminal to extraforaminal disc protrusion (series 4, image 59). Moderate bilateral facet hypertrophy. Mild narrowing of the lateral recesses bilaterally. Central canal remains patent. Probable mild bilateral L3 foraminal stenosis. L4-5: Trace anterolisthesis with degenerative intervertebral disc space narrowing. Mild diffuse disc bulge. Severe right with moderate left facet arthrosis. No significant spinal stenosis. Foramina remain patent. L5-S1: Trace anterolisthesis. No significant disc bulge. Moderate bilateral facet hypertrophy. No stenosis. IMPRESSION: 1. Mild height loss with age-indeterminate lucency extending through the partially visualized inferior endplate of 624THL. Correlation with physical exam for possible  pain at this location recommended. Additionally, further assessment with dedicated CT of the thoracic spine could be performed for further evaluation as warranted. 2. No other acute traumatic injury within the lumbar spine. 3. Sigmoid scoliotic curvature with multilevel degenerative spondylolysis and facet arthrosis as above. No high-grade spinal stenosis. 4. Small layering bilateral pleural effusions, partially visualized. 5. Aortic Atherosclerosis (ICD10-I70.0). Electronically Signed   By: Jeannine Boga M.D.   On: 02/27/2021 22:52   _______________________________________________________________________________________________________ Latest  Blood pressure (!) 119/54, pulse 72, temperature 98 F (36.7 C), temperature source Oral, resp. rate 18, SpO2 95 %.   Vitals  labs and radiology finding personally reviewed  Review of Systems:    Pertinent positives include:   abdominal pain neurological complaints, back pain.  Constitutional:  No weight loss, night sweats, Fevers, chills, fatigue, weight loss  HEENT:  No headaches, Difficulty swallowing,Tooth/dental problems,Sore throat,  No sneezing, itching, ear ache, nasal congestion, post nasal drip,  Cardio-vascular:  No chest pain, Orthopnea, PND, anasarca, dizziness, palpitations.no Bilateral lower extremity swelling  GI:  No heartburn, indigestion,, nausea, vomiting, diarrhea, change in bowel habits, loss of appetite, melena, blood in stool, hematemesis Resp:  no shortness of breath at rest. No dyspnea on exertion, No excess mucus, no productive cough, No non-productive cough, No coughing up of blood.No change in color of mucus.No wheezing. Skin:  no rash or lesions. No jaundice GU:  no dysuria, change in color of urine, no urgency or frequency. No straining to urinate.  No flank pain.  Musculoskeletal:  No joint pain or no joint swelling. No decreased range of motion. No Psych:  No change in mood or affect. No depression or  anxiety. No memory loss.  Neuro: no localizing  no tingling, no weakness, no double vision, no gait abnormality, no slurred speech, no confusion  All  systems reviewed and apart from HOPI all are negative _______________________________________________________________________________________________ Past Medical History:   Past Medical History:  Diagnosis Date   Hypertension    Thyroid disease    Vertigo       Past Surgical History:  Procedure Laterality Date   ABDOMINAL HYSTERECTOMY     APPENDECTOMY     HERNIA REPAIR      Social History:  Ambulatory  walker        reports that she has never smoked. She has never used smokeless tobacco. She reports that she does not drink alcohol and does not use drugs.     Family History: No history of stroke History reviewed. No pertinent family history. ______________________________________________________________________________________________ Allergies: Allergies  Allergen Reactions   Citalopram    Demerol [Meperidine] Nausea And Vomiting and Other (See Comments)    Dizziness   Mirtazapine    Penicillins Hives    Has patient had a PCN reaction causing immediate rash, facial/tongue/throat swelling, SOB or lightheadedness with hypotension:No Has patient had a PCN reaction causing severe rash involving mucus membranes or skin necrosis:unsure Has patient had a PCN reaction that required hospitalization:No Has patient had a PCN reaction occurring within the last 10 years:No If all of the above answers are "NO", then may proceed with Cephalosporin use.    Sertraline      Prior to Admission medications   Medication Sig Start Date End Date Taking? Authorizing Provider  acetaminophen (TYLENOL) 500 MG tablet Take 500 mg by mouth every 12 (twelve) hours as needed for moderate pain or mild pain.    [provider]  aspirin EC 81 MG tablet Take 81 mg by mouth daily.    [provider]  buPROPion (WELLBUTRIN) 75 MG  tablet Take 75 mg by mouth daily.    [provider]  calcium-vitamin D (OSCAL WITH D) 500-200 MG-UNIT tablet Take 1 tablet by mouth daily with breakfast.    [provider]  hydrochlorothiazide (MICROZIDE) 12.5 MG capsule Take 12.5 mg by mouth daily.    [provider]  levothyroxine (SYNTHROID, LEVOTHROID) 50 MCG tablet Take 50 mcg by mouth daily.    [provider]  LORazepam (ATIVAN) 0.5 MG tablet Take 0.5 mg by mouth at bedtime.    [provider]  meclizine (ANTIVERT) 12.5 MG tablet Take 1 tablet (12.5 mg total) by mouth 3 (three) times daily as needed for dizziness. 07/06/20   Cathren LaineSteinl, Kevin, MD  meclizine (ANTIVERT) 25 MG tablet Take 1 tablet (25 mg total) by mouth 3 (three) times daily as needed for dizziness. 08/25/15   Arby BarrettePfeiffer, Marcy, MD  Melatonin 10 MG TABS Take 1 tablet by mouth at bedtime as needed (sleep).    [provider]  metoprolol succinate (TOPROL-XL) 25 MG 24 hr tablet Take 12.5 mg by mouth daily. 08/02/15   [provider]  Multiple Vitamin (MULTIVITAMIN WITH MINERALS) TABS Take 1 tablet by mouth daily.    [provider]  Multiple Vitamins-Minerals (PRESERVISION AREDS 2) CAPS Take 2 capsules by mouth in the morning.    [provider]  OVER THE COUNTER MEDICATION Take 1 tablet by mouth every 8 (eight) hours as needed (cough). CORICIDIN HBP cough/ cold 4-30mg     [provider]  Polyethyl Glycol-Propyl Glycol (SYSTANE) 0.4-0.3 % SOLN Apply 1 drop to eye every 6 (six) hours as needed (dry eys).    [provider]  pravastatin (PRAVACHOL) 40 MG tablet Take 40 mg by mouth every evening.  [provider]  traZODone (DESYREL) 50 MG tablet Take 25 mg by mouth at bedtime as needed. 07/06/20   [provider]    ___________________________________________________________________________________________________ Physical Exam: Vitals with BMI 03/06/2021 03/06/2021  03/02/2021  Height - - -  Weight - - -  BMI - - -  Systolic - - 119  Diastolic - - 54  Pulse 72 80 64   1. General:  in No  Acute distress    Chronically ill    -appearing 2. Psychological: Alert and   Oriented 3. Head/ENT:    Dry Mucous Membranes                          Head Non traumatic, neck supple                       Poor Dentition 4. SKIN: normal  Skin turgor,  Skin clean Dry and intact no rash 5. Heart: Regular rate and rhythm no  Murmur, no Rub or gallop 6. Lungs:   no wheezes or crackles   7. Abdomen: Soft,  non-tender, Non distended  bowel sounds present 8. Lower extremities: no clubbing, cyanosis, no  edema 9. Neurologically slightly diminished strength in the left unclear if this is chronic or change from prior  10. MSK: Normal range of motion    Chart has been reviewed  ______________________________________________________________________________________________  Assessment/Plan  88 y.o. female with medical history significant of hypothyroidism, vertigo, history of stroke, hypertension  Admitted for hyponatremia  Present on Admission:  Compression fracture of T11 vertebra (HCC)  Hyponatremia  Hypertension  Hypothyroidism  Abdominal pain     Hypertension Permissive HTN  Hypothyroidism - Check TSH continue home medications at current dose   Compression fracture of T11 vertebra (HCC) Back pain seems more chronic and localized to the lumbar spine   Hyponatremia   - order urine electrolytes, Gently rehydrate with normal saline    -Frequent labs Check TSH Hold hydrochlorothiazide    Abdominal pain unclear etiology  KUB showing bowel displacement rec CT will order   Left-sided weakness Unclear duration, pt has chronic left side weakness from prior CVA but she states it is worse Will order MRI and if positive will need to discuss with neurology    Other plan as per orders.  DVT prophylaxis:  SCD      Code Status:   DNR/DNI   as per  ORDRES that came with pt     Family Communication:   Family not at  Bedside  Attempted to call  Disposition Plan:                             Back to current facility when stable                            Following barriers for discharge:                            Electrolytes corrected                                                          Pain  controlled with PO medications                                                      Will need to be able to tolerate PO                                             Would benefit from PT/OT eval prior to DC  Ordered                   Swallow eval - SLP ordered                                      Transition of care consulted                                    Palliative care    consulted                                    Consults called:  none    Admission status:  ED Disposition     ED Disposition  Admit   Condition  --   [place]: [place] [[id number]]  Level of Care: Telemetry [5]  Admit to tele based on following criteria: Other see comments  Comments: hyponatremia  May admit patient to [facility] or [facility] if equivalent level of care is available:: No  Covid Evaluation: Asymptomatic Screening Protocol (No Symptoms)  Diagnosis: Hyponatremia [id number]  Admitting Physician: Toy Baker [[id number]]  Attending Physician: Toy Baker [[id number]]  Estimated length of stay: past midnight tomorrow  Certification:: I certify this patient will need inpatient services for at least 2 midnights             inpatient     I Expect 2 midnight stay secondary to severity of patient's current illness need for inpatient interventions justified by the following:  hemodynamic instability despite optimal treatment    Severe lab/radiological/exam abnormalities including:    hyponatremia and extensive comorbidities including:    CHF     CKD    history of stroke with residual deficits     That are currently affecting medical management.   I expect  patient to be hospitalized for 2 midnights requiring inpatient medical care.  Patient is at high risk for adverse outcome (such as loss of life or disability) if not treated.  Indication for inpatient stay as follows:    Need for operative/procedural  intervention     Need for  IV fluids,      Level of care     tele  For 12H      Lab Results  Component Value Date   [place] NEGATIVE 03/11/2021     Precautions: admitted as   Covid Negative     Corinn Stoltzfus 03/11/2021, 3:03 AM    [facility]     after 2 AM please page floor coverage PA If 7AM-7PM, please contact the day  team taking care of the patient using [email or web address]   Patient was evaluated in the context of the global COVID-19 pandemic, which necessitated consideration that the patient might be at risk for infection with the SARS-CoV-2 virus that causes COVID-19. Institutional protocols and algorithms that pertain to the evaluation of patients at risk for COVID-19 are in a state of rapid change based on information released by regulatory bodies including the CDC and federal and state organizations. These policies and algorithms were followed during the patient's care.

## 2021-03-11 NOTE — Assessment & Plan Note (Signed)
Back pain seems more chronic and localized to the lumbar spine

## 2021-03-11 NOTE — Assessment & Plan Note (Signed)
Permissive HTN  °

## 2021-03-11 NOTE — Assessment & Plan Note (Signed)
° -   order urine electrolytes, Gently rehydrate with normal saline    -Frequent labs Check TSH Hold hydrochlorothiazide

## 2021-03-12 DIAGNOSIS — E871 Hypo-osmolality and hyponatremia: Secondary | ICD-10-CM | POA: Diagnosis not present

## 2021-03-12 LAB — BASIC METABOLIC PANEL
Anion gap: 9 (ref 5–15)
BUN: 10 mg/dL (ref 8–23)
CO2: 25 mmol/L (ref 22–32)
Calcium: 8 mg/dL — ABNORMAL LOW (ref 8.9–10.3)
Chloride: 92 mmol/L — ABNORMAL LOW (ref 98–111)
Creatinine, Ser: 0.73 mg/dL (ref 0.44–1.00)
GFR, Estimated: >60 mL/min (ref >60)
Glucose, Bld: 89 mg/dL (ref 70–99)
Potassium: 3.4 mmol/L — ABNORMAL LOW (ref 3.5–5.1)
Sodium: 126 mmol/L — ABNORMAL LOW (ref 135–145)

## 2021-03-12 LAB — MAGNESIUM: Magnesium: 1.8 mg/dL (ref 1.7–2.4)

## 2021-03-12 LAB — CBC
HCT: 34.4 % — ABNORMAL LOW (ref 36.0–46.0)
Hemoglobin: 11.8 g/dL — ABNORMAL LOW (ref 12.0–15.0)
MCH: 31.9 pg (ref 26.0–34.0)
MCHC: 34.3 g/dL (ref 30.0–36.0)
MCV: 93 fL (ref 80.0–100.0)
Platelets: 263 10*3/uL (ref 150–400)
RBC: 3.7 MIL/uL — ABNORMAL LOW (ref 3.87–5.11)
RDW: 13.1 % (ref 11.5–15.5)
WBC: 11.9 10*3/uL — ABNORMAL HIGH (ref 4.0–10.5)
nRBC: 0 % (ref 0.0–0.2)

## 2021-03-12 MED ORDER — METOPROLOL TARTRATE 25 MG PO TABS
25.0000 mg | ORAL_TABLET | Freq: Two times a day (BID) | ORAL | Status: DC
  Administered 2021-03-12: 22:00:00 25 mg via ORAL
  Filled 2021-03-12: qty 1

## 2021-03-12 MED ORDER — LORAZEPAM 0.5 MG PO TABS
0.5000 mg | ORAL_TABLET | Freq: Every day | ORAL | Status: AC
  Administered 2021-03-12: 22:00:00 0.5 mg via ORAL
  Filled 2021-03-12: qty 1

## 2021-03-12 MED ORDER — DILTIAZEM HCL 30 MG PO TABS: 30.0000 mg | ORAL_TABLET | Freq: Four times a day (QID) | ORAL | Status: DC | PRN

## 2021-03-12 MED ORDER — SODIUM CHLORIDE 0.9 % IV BOLUS
250.0000 mL | Freq: Once | INTRAVENOUS | Status: AC
  Administered 2021-03-12: 07:00:00 250 mL via INTRAVENOUS

## 2021-03-12 MED ORDER — MAGNESIUM SULFATE 2 GM/50ML IV SOLN
2.0000 g | Freq: Once | INTRAVENOUS | Status: AC
  Administered 2021-03-12: 10:00:00 2 g via INTRAVENOUS
  Filled 2021-03-12: qty 50

## 2021-03-12 MED ORDER — METOPROLOL TARTRATE 25 MG PO TABS
12.5000 mg | ORAL_TABLET | Freq: Two times a day (BID) | ORAL | Status: DC
  Administered 2021-03-12: 11:00:00 12.5 mg via ORAL
  Filled 2021-03-12: qty 1

## 2021-03-12 MED ORDER — METOPROLOL TARTRATE 5 MG/5ML IV SOLN
5.0000 mg | Freq: Once | INTRAVENOUS | Status: AC
  Administered 2021-03-12: 19:00:00 5 mg via INTRAVENOUS
  Filled 2021-03-12: qty 5

## 2021-03-12 MED ORDER — LORAZEPAM 0.5 MG PO TABS
0.5000 mg | ORAL_TABLET | Freq: Once | ORAL | Status: AC
  Administered 2021-03-12: 06:00:00 0.5 mg via ORAL
  Filled 2021-03-12: qty 1

## 2021-03-12 NOTE — Progress Notes (Signed)
°   03/12/21 0628  Vitals  Temp 98.1 F (36.7 C)  Temp Source Oral  BP 125/73  MAP (mmHg) 84  BP Location Right Arm  BP Method Automatic  Patient Position (if appropriate) Lying  Pulse Rate (!) 146  Pulse Rate Source Monitor  Resp 20  MEWS COLOR  MEWS Score Color Yellow  Oxygen Therapy  SpO2 97 %  O2 Device Room Air  MEWS Score  MEWS Temp 0  MEWS Systolic 0  MEWS Pulse 3  MEWS RR 0  MEWS LOC 0  MEWS Score 3   HR still elevated. NP Garner Nash notified

## 2021-03-12 NOTE — [place] FL2 (Signed)
[place] MEDICAID FL2 LEVEL OF CARE SCREENING TOOL     IDENTIFICATION  Patient Name: Candace Perkins Birthdate: 07/25/1920 Sex: female Admission Date (Current Location): 03/09/2021  [facility] and [place] Number:  Herbalist and Address:  [facility],  [address], [place]      Provider Number: [id number]  Attending Physician Name and Address:  Cherene Altes, MD  Relative Name and Phone Number:       Current Level of Care: Hospital Recommended Level of Care: Perkins Prior Approval Number:    Date Approved/Denied:   PASRR Number: [id number]  Discharge Plan: SNF    Current Diagnoses: Patient Active Problem List   Diagnosis Date Noted   Compression fracture of T11 vertebra ([place]) 03/11/2021   Hyponatremia 03/11/2021   Abdominal pain 03/11/2021   Left-sided weakness 03/11/2021   Wheezing 05/15/2012   Stroke ([place]) 05/13/2012   Hypothyroidism 05/13/2012   Hypertension     Orientation RESPIRATION BLADDER Height & Weight     Place, Situation, Time, Self  Normal Incontinent Weight: 47.6 kg Height:  5\' 1"  (154.9 cm)  BEHAVIORAL SYMPTOMS/MOOD NEUROLOGICAL BOWEL NUTRITION STATUS      Continent Diet  AMBULATORY STATUS COMMUNICATION OF NEEDS Skin   Extensive Assist Verbally Bruising, Skin abrasions                       Personal Care Assistance Level of Assistance  Dressing, Bathing, Feeding Bathing Assistance: Limited assistance Feeding assistance: Limited assistance Dressing Assistance: Limited assistance     Functional Limitations Info  Speech, Hearing, Sight Sight Info: Impaired Hearing Info: Adequate Speech Info: Adequate    SPECIAL CARE FACTORS FREQUENCY  OT (By licensed OT), PT (By licensed PT)     PT Frequency: 5 x weekly OT Frequency: 5 x weekly            Contractures      Additional Factors Info  Code Status, Allergies Code Status Info: DNR Allergies Info: Citalopram, Demerol,  Mirtazapine, Penicillins, Sertraline           Current Medications (03/12/2021):  This is the current hospital active medication list Current Facility-Administered Medications  Medication Dose Route Frequency Provider Last Rate Last Admin   0.9 %  sodium chloride infusion   Intravenous Continuous Cherene Altes, MD 75 mL/hr at 03/12/21 0807 New Bag at 03/12/21 0807   acetaminophen (TYLENOL) tablet 650 mg  650 mg Oral Q6H PRN Toy Baker, MD   650 mg at 03/12/21 0202   aspirin EC tablet 81 mg  81 mg Oral Daily Doutova, Anastassia, MD   81 mg at 03/12/21 1032   buPROPion (WELLBUTRIN) tablet 75 mg  75 mg Oral Daily Doutova, Anastassia, MD   75 mg at 03/12/21 1032   calcitonin (salmon) (MIACALCIN/FORTICAL) nasal spray 1 spray  1 spray Alternating Nares Daily Cherene Altes, MD   1 spray at 03/12/21 1033   levothyroxine (SYNTHROID) tablet 50 mcg  50 mcg Oral Q0600 Toy Baker, MD   50 mcg at 03/12/21 0552   meclizine (ANTIVERT) tablet 12.5-25 mg  12.5-25 mg Oral Q6H PRN Cherene Altes, MD   25 mg at 03/11/21 2017   metoprolol tartrate (LOPRESSOR) tablet 12.5 mg  12.5 mg Oral BID Joette Catching T, MD   12.5 mg at 03/12/21 1030   polyethylene glycol (MIRALAX / GLYCOLAX) packet 17 g  17 g Oral Daily PRN Toy Baker, MD  potassium chloride SA (KLOR-CON M) CR tablet 20 mEq  20 mEq Oral BID Joette Catching T, MD   20 mEq at 03/12/21 1031   pravastatin (PRAVACHOL) tablet 40 mg  40 mg Oral QPM Doutova, Anastassia, MD       senna (SENOKOT) tablet 17.2 mg  2 tablet Oral Daily Joette Catching T, MD   17.2 mg at 03/12/21 1032   traMADol (ULTRAM) tablet 25 mg  25 mg Oral Q8H PRN Cherene Altes, MD   25 mg at 03/11/21 2106   traZODone (DESYREL) tablet 25 mg  25 mg Oral QHS Toy Baker, MD   25 mg at 03/11/21 2237     Discharge Medications: Please see discharge summary for a list of discharge medications.  Relevant Imaging Results:  Relevant Lab  Results:   Additional Information SS# [id number] Covid vaccinations x 2 Pfizer  05/11/19, 04/16/19  Cloyde Oregel, Marjie Skiff, RN

## 2021-03-12 NOTE — Progress Notes (Signed)
°   03/12/21 0548  Vitals  Temp 98.7 F (37.1 C)  Temp Source Oral  BP 132/70  MAP (mmHg) 89  BP Location Right Arm  BP Method Automatic  Patient Position (if appropriate) Lying  Pulse Rate (!) 145  Resp 18  MEWS COLOR  MEWS Score Color Yellow  Oxygen Therapy  SpO2 96 %  O2 Device Room Air  MEWS Score  MEWS Temp 0  MEWS Systolic 0  MEWS Pulse 3  MEWS RR 0  MEWS LOC 0  MEWS Score 3   Notified NP Daniels, initiated yellow mewd. Rechecked vital signs

## 2021-03-12 NOTE — Progress Notes (Addendum)
Candace Perkins  [id number] DOB: June 01, 1920 DOA: 03/08/2021 PCP: Cari Caraway, MD    Brief Narrative:  [phone] with a history of hypothyroidism, chronic hyponatremia, CVA, HTN, and vertigo who presented to the ED from her ALF with complaints of back pain and bilateral hand numbness following a fall 2 weeks prior.  Evaluation in the ER revealed a T11 compression fracture.  She was also found to have a sodium of 122, compared to a baseline of approximately 131.  Consultants:  None  Code Status: NO CODE BLUE  Antimicrobials:  None  DVT prophylaxis: SCDs  Interim Hx: Afebrile.  Tachycardia appreciated overnight with heart rates up to 145.  Blood pressure stable.  Saturation 97% room air.  Sodium improving slowly as desired. Pt resting comfortably at the time of my visit and denies complaints.   EKG has revealed atrial fibrillation today.  With resumption of beta-blocker the patient's heart rate has been better controlled, though she does still have episodes where she becomes quite tachycardic.  She is not in apparent distress.  Assessment & Plan:  Acute on chronic hyponatremia Review of records suggests baseline sodium for this patient over the last 2 years has been 130-133 - acute drop likely due to simple volume depletion and use of diuretic -continue to gently hydrate and follow trend  Newly diagnosed Atrial Fib  check TSH as patient is on thyroid replacement - possible low-grade benzo withdrawal contributing as patient is usually dosed with Ativan every night (resume tonight) -possible BB withdrawal as patient's beta-blocker was being held in setting of clinical dehydration - maximize Mg and KCl - pt not a candidate for anticoag given advanced age - goal will be consistent rate control - titrate BB - consider adding CCB if needed   Possible left-sided weakness Reportedly has chronic left-sided weakness due to prior CVA - at admission patient was not clear but suggested that this  could have worsened lately - MRI brain this admit w/o acute findings - this is likely related to her hyponatremia   T11 compression fx Exact timing of this injury unclear, but imaging does suggest this could be subacute/relatively recent - pt would not be a candidate for intervention of any kind - utilize calcitonin nasal for assistance w/ pain control   CKD stage IIIa Baseline creatinine 1.1 -creatinine presently better than her baseline with volume resuscitation  HLD Resume usual medical therapy  HTN Blood pressure controlled - monitor w/ titration of rate controlling meds   Hypothyroidism Hold usual replacement therapy - check TSH in setting of tachycardia  History of CVA  Family Communication: No family present at time of exam Disposition:   Objective: Blood pressure 125/73, pulse (!) 146, temperature 98.1 F (36.7 C), temperature source Oral, resp. rate 20, height 5\' 1"  (1.549 m), weight 47.6 kg, SpO2 97 %.  Intake/Output Summary (Last 24 hours) at 03/12/2021 0953 Last data filed at 03/12/2021 0601 Gross per 24 hour  Intake 458.66 ml  Output 1950 ml  Net -1491.34 ml    Filed Weights   03/11/21 0128  Weight: 47.6 kg    Examination: General: No acute respiratory distress Lungs: Clear to auscultation bilaterally without wheezes or crackles Cardiovascular: Irreg Irreg at ~90bpm at time of my exam  Abdomen: Nontender, nondistended, soft, bowel sounds positive, no rebound, no ascites, no appreciable mass Extremities: No significant cyanosis, clubbing, or edema bilateral lower extremities    CBC: Recent Labs  Lab 03/02/2021 1928 03/11/21 0521 03/12/21 0532  WBC 8.8  7.4 11.9*  NEUTROABS  --  5.0  --   HGB 11.5* 10.7* 11.8*  HCT 34.1* 32.4* 34.4*  MCV 93.4 94.5 93.0  PLT 284 204 [id number]    Basic Metabolic Panel: Recent Labs  Lab 02/22/2021 1928 03/11/21 0521 03/12/21 0532  NA 122* 122* 126*  K 3.4* 3.4* 3.4*  CL 81* 85* 92*  CO2 30 29 25   GLUCOSE 96 77 89   BUN 18 16 10   CREATININE 1.02* 0.89 0.73  CALCIUM 8.5* 8.0* 8.0*  MG  --  1.9 1.8  PHOS  --  3.1  --     GFR: Estimated Creatinine Clearance: 28.8 mL/min (by C-G formula based on SCr of 0.73 mg/dL).  Liver Function Tests: Recent Labs  Lab 03/09/2021 1928 03/11/21 0521  AST 21 21  ALT 17 16  ALKPHOS 107 98  BILITOT 1.0 1.2  PROT 6.3* 5.6*  ALBUMIN 3.5 3.1*     HbA1C: Hgb A1c MFr Bld  Date/Time Value Ref Range Status  05/14/2012 05:12 AM 5.6 <5.7 % Final    Comment:    (NOTE)                                                                       According to the ADA Clinical Practice Recommendations for 2011, when HbA1c is used as a screening test:  >=6.5%   Diagnostic of Diabetes Mellitus           (if abnormal result is confirmed) 5.7-6.4%   Increased risk of developing Diabetes Mellitus References:Diagnosis and Classification of Diabetes Mellitus,Diabetes Care,2011,34(Suppl 1):S62-S69 and Standards of Medical Care in         Diabetes - 2011,Diabetes [id number] (Suppl 1):S11-S61.  04/28/2009 09:00 AM  4.6 - 6.1 % Final   5.6 (NOTE) The ADA recommends the following therapeutic goal for glycemic control related to Hgb A1c measurement: Goal of therapy: <6.5 Hgb A1c  Reference: American Diabetes Association: Clinical Practice Recommendations 2010, Diabetes Care, 2010, 33: (Suppl  1).    Scheduled Meds:  aspirin EC  81 mg Oral Daily   buPROPion  75 mg Oral Daily   calcitonin (salmon)  1 spray Alternating Nares Daily   levothyroxine  50 mcg Oral Q0600   potassium chloride  20 mEq Oral BID   pravastatin  40 mg Oral QPM   senna  2 tablet Oral Daily   traZODone  25 mg Oral QHS   Continuous Infusions:  sodium chloride 75 mL/hr at 03/12/21 0807     LOS: 1 day   Cherene Altes, MD [facility] Office  [phone] Pager - Text Page per Shea Evans  If 7PM-7AM, please contact night-coverage per Amion 03/12/2021, 9:53 AM

## 2021-03-12 NOTE — TOC Initial Note (Addendum)
Transition of Care [facility]) - Initial/Assessment Note    Patient Details  Name: Candace Perkins MRN: [id number] Date of Birth: [birth year]  [facility] CM/SW Contact:    Candace Catalan, RN Phone Number: 03/12/2021, 12:53 PM  Clinical Narrative:                 Pt sleeping soundly when visited. Unable to reach daughter Candace Perkins when called number listed. Left voicemail to return my call. [place] to inquire about pt prior level of function. Spoke with Candace Perkins who requested that I fax PT/OT notes for review [phone]). Candace Perkins to call this CM back to let me know if they can accept her back at this level of care or if she needs to go to SNF first.  Addendum:  [place] called me back and decided that pt would need to go to SNF prior to going back to ALF. FL2 faxed out to area facilities.   Expected Discharge Plan: Skilled Nursing Facility Barriers to Discharge: Continued Medical Work up     Expected Discharge Plan and Services Expected Discharge Plan: [place]   Discharge Planning Services: CM Consult   Living arrangements for the past 2 months: [place]                     Prior Living Arrangements/Services Living arrangements for the past 2 months: [place] Lives with:: Facility Resident Patient language and need for interpreter reviewed:: Yes        Need for Family Participation in Patient Care: Yes (Comment) Care giver support system in place?: Yes (comment)   Criminal Activity/Legal Involvement Pertinent to Current Situation/Hospitalization: No - Comment as needed  Activities of Daily Living Home Assistive Devices/Equipment: None ADL Screening (condition at time of admission) Patient's cognitive ability adequate to safely complete daily activities?: Yes Is the patient deaf or have difficulty hearing?: No Does the patient have difficulty seeing, even when wearing glasses/contacts?:  No Does the patient have difficulty concentrating, remembering, or making decisions?: No Patient able to express need for assistance with ADLs?: Yes Does the patient have difficulty dressing or bathing?: Yes Independently performs ADLs?: No Communication: Independent Dressing (OT): Needs assistance Is this a change from baseline?: Change from baseline, expected to last <3days Grooming: Needs assistance Is this a change from baseline?: Change from baseline, expected to last <3 days Feeding: Independent Bathing: Needs assistance Is this a change from baseline?: Change from baseline, expected to last <3 days Toileting: Needs assistance Is this a change from baseline?: Change from baseline, expected to last <3 days In/Out Bed: Needs assistance Is this a change from baseline?: Change from baseline, expected to last <3 days Walks in Home: Needs assistance Is this a change from baseline?: Change from baseline, expected to last <3 days Does the patient have difficulty walking or climbing stairs?: Yes Weakness of Legs: Both Weakness of Arms/Hands: None  Permission Sought/Granted                  Emotional Assessment Appearance:: Appears stated age            Admission diagnosis:  Hyponatremia [E87.1] Abdominal pain [R10.9] Patient Active Problem List   Diagnosis Date Noted   Compression fracture of T11 vertebra ([place]) 03/11/2021   Hyponatremia 03/11/2021   Abdominal pain 03/11/2021   Left-sided weakness 03/11/2021   Wheezing 05/15/2012   Stroke ([place]) 05/13/2012   Hypothyroidism 05/13/2012   Hypertension  PCP:  Candace Caraway, MD Pharmacy:   [place] [place] [address] Phone: [phone] Fax: [phone]     Social Determinants of Health (SDOH) Interventions    Readmission Risk Interventions Readmission Risk Prevention Plan 03/12/2021  Transportation  Screening Complete  PCP or Specialist Appt within 5-7 Days Complete  Home Care Screening Complete  Medication Review (RN CM) Complete  Some recent data might be hidden

## 2021-03-13 DIAGNOSIS — E871 Hypo-osmolality and hyponatremia: Secondary | ICD-10-CM | POA: Diagnosis not present

## 2021-03-13 DIAGNOSIS — I10 Essential (primary) hypertension: Secondary | ICD-10-CM | POA: Diagnosis not present

## 2021-03-13 DIAGNOSIS — S22080A Wedge compression fracture of T11-T12 vertebra, initial encounter for closed fracture: Secondary | ICD-10-CM | POA: Diagnosis not present

## 2021-03-13 DIAGNOSIS — R531 Weakness: Secondary | ICD-10-CM | POA: Diagnosis not present

## 2021-03-13 LAB — CBC
HCT: 32.4 % — ABNORMAL LOW (ref 36.0–46.0)
Hemoglobin: 10.7 g/dL — ABNORMAL LOW (ref 12.0–15.0)
MCH: 31.8 pg (ref 26.0–34.0)
MCHC: 33 g/dL (ref 30.0–36.0)
MCV: 96.4 fL (ref 80.0–100.0)
Platelets: 219 K/uL (ref 150–400)
RBC: 3.36 MIL/uL — ABNORMAL LOW (ref 3.87–5.11)
RDW: 13.4 % (ref 11.5–15.5)
WBC: 8.8 K/uL (ref 4.0–10.5)
nRBC: 0 % (ref 0.0–0.2)

## 2021-03-13 LAB — BASIC METABOLIC PANEL WITH GFR
Anion gap: 7 (ref 5–15)
BUN: 16 mg/dL (ref 8–23)
CO2: 24 mmol/L (ref 22–32)
Calcium: 7.7 mg/dL — ABNORMAL LOW (ref 8.9–10.3)
Chloride: 98 mmol/L (ref 98–111)
Creatinine, Ser: 0.84 mg/dL (ref 0.44–1.00)
GFR, Estimated: >60 mL/min
Glucose, Bld: 83 mg/dL (ref 70–99)
Potassium: 4.3 mmol/L (ref 3.5–5.1)
Sodium: 129 mmol/L — ABNORMAL LOW (ref 135–145)

## 2021-03-13 LAB — BLOOD GAS, VENOUS
Acid-base deficit: 0.9 mmol/L (ref 0.0–2.0)
Bicarbonate: 23 mmol/L (ref 20.0–28.0)
O2 Saturation: 88.1 %
Patient temperature: 98.6
pCO2, Ven: 37.5 mmHg — ABNORMAL LOW (ref 44.0–60.0)
pH, Ven: 7.405 (ref 7.250–7.430)
pO2, Ven: 56.4 mmHg — ABNORMAL HIGH (ref 32.0–45.0)

## 2021-03-13 LAB — COMPREHENSIVE METABOLIC PANEL WITH GFR
ALT: 20 U/L (ref 0–44)
AST: 30 U/L (ref 15–41)
Albumin: 3.1 g/dL — ABNORMAL LOW (ref 3.5–5.0)
Alkaline Phosphatase: 134 U/L — ABNORMAL HIGH (ref 38–126)
Anion gap: 8 (ref 5–15)
BUN: 20 mg/dL (ref 8–23)
CO2: 22 mmol/L (ref 22–32)
Calcium: 8.1 mg/dL — ABNORMAL LOW (ref 8.9–10.3)
Chloride: 98 mmol/L (ref 98–111)
Creatinine, Ser: 0.89 mg/dL (ref 0.44–1.00)
GFR, Estimated: 58 mL/min — ABNORMAL LOW
Glucose, Bld: 120 mg/dL — ABNORMAL HIGH (ref 70–99)
Potassium: 5.2 mmol/L — ABNORMAL HIGH (ref 3.5–5.1)
Sodium: 128 mmol/L — ABNORMAL LOW (ref 135–145)
Total Bilirubin: 1.1 mg/dL (ref 0.3–1.2)
Total Protein: 6.2 g/dL — ABNORMAL LOW (ref 6.5–8.1)

## 2021-03-13 LAB — GLUCOSE, CAPILLARY: Glucose-Capillary: 129 mg/dL — ABNORMAL HIGH (ref 70–99)

## 2021-03-13 LAB — TSH: TSH: 2.461 u[IU]/mL (ref 0.350–4.500)

## 2021-03-13 MED ORDER — METOPROLOL TARTRATE 5 MG/5ML IV SOLN
5.0000 mg | Freq: Four times a day (QID) | INTRAVENOUS | Status: DC | PRN
  Administered 2021-03-13: 22:00:00 5 mg via INTRAVENOUS
  Filled 2021-03-13: qty 5

## 2021-03-13 MED ORDER — DILTIAZEM HCL-DEXTROSE 125-5 MG/125ML-% IV SOLN (PREMIX)
5.0000 mg/h | INTRAVENOUS | Status: DC
  Administered 2021-03-14: 5 mg/h via INTRAVENOUS
  Filled 2021-03-13 (×2): qty 125

## 2021-03-13 MED ORDER — LEVOTHYROXINE SODIUM 25 MCG PO TABS
12.5000 ug | ORAL_TABLET | Freq: Every day | ORAL | Status: DC
  Administered 2021-03-13 – 2021-03-16 (×4): 12.5 ug via ORAL
  Filled 2021-03-13 (×4): qty 1

## 2021-03-13 MED ORDER — DILTIAZEM HCL 30 MG PO TABS
60.0000 mg | ORAL_TABLET | Freq: Four times a day (QID) | ORAL | Status: DC | PRN
  Filled 2021-03-13: qty 2

## 2021-03-13 MED ORDER — ACETAMINOPHEN 325 MG PO TABS
650.0000 mg | ORAL_TABLET | Freq: Four times a day (QID) | ORAL | Status: AC | PRN
  Administered 2021-03-15: 650 mg via ORAL

## 2021-03-13 MED ORDER — SODIUM CHLORIDE 0.9 % IV SOLN: INTRAVENOUS | Status: DC

## 2021-03-13 MED ORDER — SODIUM CHLORIDE 0.9 % IV BOLUS
250.0000 mL | Freq: Once | INTRAVENOUS | Status: AC
  Administered 2021-03-13: 11:00:00 250 mL via INTRAVENOUS

## 2021-03-13 MED ORDER — METOPROLOL TARTRATE 50 MG PO TABS
50.0000 mg | ORAL_TABLET | Freq: Two times a day (BID) | ORAL | Status: DC
  Administered 2021-03-13 – 2021-03-16 (×7): 50 mg via ORAL
  Filled 2021-03-13 (×8): qty 1

## 2021-03-13 MED ORDER — ACETAMINOPHEN 650 MG RE SUPP: 650.0000 mg | RECTAL | Status: AC | PRN

## 2021-03-13 NOTE — Progress Notes (Signed)
[facility] PROGRESS NOTE    Progress Note  Candace Perkins  [id number] DOB: November 29, 1920 DOA: 02/19/2021 PCP: Cari Caraway, MD     Brief Narrative:   Candace Perkins is an 88 y.o. female past medical history of hypothyroidism, chronic hyponatremia CVA who comes in from the ED with complaints of back pain and bilateral hand numbness she had a fall 2-week prior to admission and evaluation in the ED was found to have a T11 compression fracture she was also found to have a sodium of 122.  Sodium has been improving nicely, twelve-lead EKG done on 03/12/2021 showed atrial fibrillation her beta-blocker was resumed and her heart rate has improved.      Assessment/Plan:   Hypovolemic hyponatremia, superimposed on chronic with a baseline like around 1 30-133: She was started on IV fluids and her sodium is improving nicely. Continue IV fluids recheck a basic metabolic panel tomorrow morning.  New onset A. fib with RVR: Her beta-blocker was held in the clinical setting of dehydration on admission. Patient not a candidate for anticoagulation due to advanced age. She was started back on her beta-blocker her heart rate is slowly improving. Continue diltiazem as needed.  Possibly left-sided weakness: MRI of the brain showed no acute findings. She had a previous CVA so this left-sided weakness probably chronic.  T11 compression fracture: Exact timing is unclear but images suggestive of acute patient not a candidate for intervention. Continue to use calcitonin nasally  Chronic kidney disease stage IIIa: Creatinine at baseline.  Hyperlipidemia: Continue medication.  Essential hypertension: Continue metoprolol. Hold diuretic therapy she may need to go home off diuretics.  Hypothyroidism: TSH  2.4 resume Synthroid at a lower dose in the setting of new onset A. fib with RVR can be increased as an outpatient..  History of CVA: Noted.   DVT prophylaxis: lovenox Family  Communication:none Status is: Inpatient  Remains inpatient appropriate because: New onset of A. fib with RVR probably back to skilled in 72 to 48 hours.        Code Status:     Code Status Orders  (From admission, onward)           Start     Ordered   03/11/21 0251  Do not attempt resuscitation (DNR)  Continuous       Question Answer Comment  In the event of cardiac or respiratory ARREST Do not call a code blue   In the event of cardiac or respiratory ARREST Do not perform Intubation, CPR, defibrillation or ACLS   In the event of cardiac or respiratory ARREST Use medication by any route, position, wound care, and other measures to relive pain and suffering. May use oxygen, suction and manual treatment of airway obstruction as needed for comfort.      03/11/21 0250           Code Status History     This patient has a current code status but no historical code status.      Advance Directive Documentation    Flowsheet Row Most Recent Value  Type of Advance Directive Out of facility DNR (pink MOST or yellow form)  Pre-existing out of facility DNR order (yellow form or pink MOST form) Yellow form placed in chart (order not valid for inpatient use)  "MOST" Form in Place? --         IV Access:   Peripheral IV   Procedures and diagnostic studies:   MR BRAIN WO CONTRAST  Result Date:  03/11/2021 CLINICAL DATA:  Neuro deficit, acute, stroke suspected EXAM: MRI HEAD WITHOUT CONTRAST TECHNIQUE: Multiplanar, multiecho pulse sequences of the brain and surrounding structures were obtained without intravenous contrast. COMPARISON:  2014 FINDINGS: Brain: There is no acute infarction or intracranial hemorrhage. There is no intracranial mass, mass effect, or edema. There is no hydrocephalus or extra-axial fluid collection. Ventricles and sulci are enlarged reflecting parenchymal volume loss patchy and confluent areas of T2 hyperintensity in the supratentorial white matter  are nonspecific but may reflect chronic microvascular ischemic changes. There are chronic small vessel infarcts of the right corona radiata and left caudate. Small bilateral chronic cerebellar infarcts are again identified. Vascular: Major vessel flow voids at the skull base are preserved. Skull and upper cervical spine: Normal marrow signal is preserved. Sinuses/Orbits: Paranasal sinuses are aerated. Orbits are unremarkable. Other: Sella is unremarkable.  Mastoid air cells are clear. IMPRESSION: No acute infarction, hemorrhage, or mass. Parenchymal volume loss and chronic microvascular ischemic changes, which have progressed since 2014. Electronically Signed   By: Macy Mis M.D.   On: 03/11/2021 13:38   CT ABDOMEN PELVIS W CONTRAST  Result Date: 03/11/2021 CLINICAL DATA:  Abdominal pain, acute, nonlocalized. EXAM: CT ABDOMEN AND PELVIS WITH CONTRAST TECHNIQUE: Multidetector CT imaging of the abdomen and pelvis was performed using the standard protocol following bolus administration of intravenous contrast. RADIATION DOSE REDUCTION: This exam was performed according to the departmental dose-optimization program which includes automated exposure control, adjustment of the mA and/or kV according to patient size and/or use of iterative reconstruction technique. CONTRAST:  50mL OMNIPAQUE IOHEXOL 300 MG/ML  SOLN COMPARISON:  Radiography same day FINDINGS: Lower chest: Mild scarring at the lung bases. Bilateral pleural effusions layering dependently. Mild cardiomegaly. Hepatobiliary: Liver parenchyma is normal. Few small stones dependent within the gallbladder. No visible ductal stone. Mild ductal prominence, often seen at this age. The gallstones are numerous and small in could pass into the ductal system. If there is clinical concern regarding a ductal stone, MRCP would be suggested. Pancreas: Normal Spleen: Normal Adrenals/Urinary Tract: Adrenal glands are normal. Kidneys are normal. Bladder is full but  normal. Stomach/Bowel: Stomach, small intestine and colon all appear normal. Vascular/Lymphatic: Aortic atherosclerosis. No aneurysm. IVC is normal. No adenopathy. Reproductive: Previous hysterectomy.  No pelvic mass. Other: Previous anterior abdominal wall sutures. No evidence of hernia. There is a T11 compression fracture with loss of height of 50%. Mild posterior bowing of the posterior margin of the vertebral body. This may be a recent fracture. IMPRESSION: Numerous small stones dependent in the gallbladder. Mild prominence of the gallbladder and biliary ductal system, without identifiable ductal stone. If there is concern regarding acute biliary disease, consider ultrasound or nuclear medicine hepatobiliary scan if the concern is that of cholecystitis or consider MRCP if the concern is that of choledocholithiasis. Bilateral pleural effusions layering dependently with mild dependent atelectasis. Aortic Atherosclerosis (ICD10-I70.0). T11 compression fracture with loss of height of 50%. The exact age is indeterminate but I suspect this is a recent fracture Electronically Signed   By: Nelson Chimes M.D.   On: 03/11/2021 10:47     Medical Consultants:   None.   Subjective:    Astraea S Abad no complaints today feels better.  Objective:    Vitals:   03/12/21 1928 03/12/21 2227 03/12/21 2228 03/13/21 0436  BP: 135/64 133/71 110/61 137/82  Pulse: (!) 110 (!) 105 (!) 106 (!) 107  Resp: 18  18 14   Temp: 98.5 F (36.9 C)  98.6  F (37 C) 99 F (37.2 C)  TempSrc: Oral  Oral Oral  SpO2: 98%  97% 97%  Weight:      Height:       SpO2: 97 %   Intake/Output Summary (Last 24 hours) at 03/13/2021 0934 Last data filed at 03/12/2021 1955 Gross per 24 hour  Intake 60 ml  Output 200 ml  Net -140 ml   Filed Weights   03/11/21 0128  Weight: 47.6 kg    Exam: General exam: In no acute distress. Respiratory system: Good air movement and clear to auscultation. Cardiovascular system: S1 & S2  heard, RRR. No JVD. Gastrointestinal system: Abdomen is nondistended, soft and nontender.  Extremities: No pedal edema. Skin: No rashes, lesions or ulcers Psychiatry: Judgement and insight appear normal. Mood & affect appropriate.    Data Reviewed:    Labs: Basic Metabolic Panel: Recent Labs  Lab 02/27/2021 1928 03/11/21 0521 03/12/21 0532 03/13/21 0508  NA 122* 122* 126* 129*  K 3.4* 3.4* 3.4* 4.3  CL 81* 85* 92* 98  CO2 30 29 25 24   GLUCOSE 96 77 89 83  BUN 18 16 10 16   CREATININE 1.02* 0.89 0.73 0.84  CALCIUM 8.5* 8.0* 8.0* 7.7*  MG  --  1.9 1.8  --   PHOS  --  3.1  --   --    GFR Estimated Creatinine Clearance: 27.4 mL/min (by C-G formula based on SCr of 0.84 mg/dL). Liver Function Tests: Recent Labs  Lab 02/26/2021 1928 03/11/21 0521  AST 21 21  ALT 17 16  ALKPHOS 107 98  BILITOT 1.0 1.2  PROT 6.3* 5.6*  ALBUMIN 3.5 3.1*   No results for input(s): LIPASE, AMYLASE in the last 168 hours. No results for input(s): AMMONIA in the last 168 hours. Coagulation profile No results for input(s): INR, PROTIME in the last 168 hours. COVID-19 Labs  No results for input(s): DDIMER, FERRITIN, LDH, CRP in the last 72 hours.  Lab Results  Component Value Date   [place] NEGATIVE 03/11/2021    CBC: Recent Labs  Lab 03/12/2021 1928 03/11/21 0521 03/12/21 0532 03/13/21 0508  WBC 8.8 7.4 11.9* 8.8  NEUTROABS  --  5.0  --   --   HGB 11.5* 10.7* 11.8* 10.7*  HCT 34.1* 32.4* 34.4* 32.4*  MCV 93.4 94.5 93.0 96.4  PLT 284 204 263 219   Cardiac Enzymes: Recent Labs  Lab 03/11/21 0521  CKTOTAL 31*   BNP (last 3 results) No results for input(s): PROBNP in the last 8760 hours. CBG: No results for input(s): GLUCAP in the last 168 hours. D-Dimer: No results for input(s): DDIMER in the last 72 hours. Hgb A1c: No results for input(s): HGBA1C in the last 72 hours. Lipid Profile: No results for input(s): CHOL, HDL, LDLCALC, TRIG, CHOLHDL, LDLDIRECT in the last 72  hours. Thyroid function studies: Recent Labs    03/13/21 0508  TSH 2.461   Anemia work up: No results for input(s): VITAMINB12, FOLATE, FERRITIN, TIBC, IRON, RETICCTPCT in the last 72 hours. Sepsis Labs: Recent Labs  Lab 02/24/2021 1928 03/11/21 0521 03/12/21 0532 03/13/21 0508  WBC 8.8 7.4 11.9* 8.8   Microbiology Recent Results (from the past 240 hour(s))  Resp Panel by RT-PCR (Flu A&B, Covid) Nasopharyngeal Swab     Status: None   Collection Time: 03/11/21  1:00 AM   Specimen: Nasopharyngeal Swab; Nasopharyngeal(NP) swabs in vial transport medium  Result Value Ref Range Status   SARS Coronavirus 2 by RT PCR NEGATIVE  NEGATIVE Final    Comment: (NOTE) SARS-CoV-2 target nucleic acids are NOT DETECTED.  The SARS-CoV-2 RNA is generally detectable in upper respiratory specimens during the acute phase of infection. The lowest concentration of SARS-CoV-2 viral copies this assay can detect is 138 copies/mL. A negative result does not preclude SARS-Cov-2 infection and should not be used as the sole basis for treatment or other patient management decisions. A negative result may occur with  improper specimen collection/handling, submission of specimen other than nasopharyngeal swab, presence of viral mutation(s) within the areas targeted by this assay, and inadequate number of viral copies(<138 copies/mL). A negative result must be combined with clinical observations, patient history, and epidemiological information. The expected result is Negative.  Fact Sheet for Patients:  EntrepreneurPulse.com.au  Fact Sheet for Healthcare Providers:  IncredibleEmployment.be  This test is no t yet approved or cleared by the [place] FDA and  has been authorized for detection and/or diagnosis of SARS-CoV-2 by FDA under an Emergency Use Authorization (EUA). This EUA will remain  in effect (meaning this test can be used) for the duration of  the COVID-19 declaration under Section 564(b)(1) of the Act, 21 U.S.C.section 360bbb-3(b)(1), unless the authorization is terminated  or revoked sooner.       Influenza A by PCR NEGATIVE NEGATIVE Final   Influenza B by PCR NEGATIVE NEGATIVE Final    Comment: (NOTE) The Xpert Xpress SARS-CoV-2/FLU/RSV plus assay is intended as an aid in the diagnosis of influenza from Nasopharyngeal swab specimens and should not be used as a sole basis for treatment. Nasal washings and aspirates are unacceptable for Xpert Xpress SARS-CoV-2/FLU/RSV testing.  Fact Sheet for Patients: EntrepreneurPulse.com.au  Fact Sheet for Healthcare Providers: IncredibleEmployment.be  This test is not yet approved or cleared by the [place] FDA and has been authorized for detection and/or diagnosis of SARS-CoV-2 by FDA under an Emergency Use Authorization (EUA). This EUA will remain in effect (meaning this test can be used) for the duration of the COVID-19 declaration under Section 564(b)(1) of the Act, 21 U.S.C. section 360bbb-3(b)(1), unless the authorization is terminated or revoked.  Performed at [facility], [place] [address], [place], [place] [address]      Medications:    aspirin EC  81 mg Oral Daily   buPROPion  75 mg Oral Daily   calcitonin (salmon)  1 spray Alternating Nares Daily   metoprolol tartrate  50 mg Oral BID   potassium chloride  20 mEq Oral BID   pravastatin  40 mg Oral QPM   senna  2 tablet Oral Daily   traZODone  25 mg Oral QHS   Continuous Infusions:  sodium chloride 50 mL/hr at 03/13/21 0045      LOS: 2 days   Charlynne Cousins  [facility]  03/13/2021, 9:34 AM

## 2021-03-13 NOTE — Progress Notes (Signed)
Occupational Therapy Treatment Patient Details Name: Candace Perkins MRN: [id number] DOB: 1920-06-06 Today's Date: 03/13/2021   History of present illness 88 y.o. female with medical history significant of hypothyroidism, vertigo, history of stroke, hypertension and admitted 03/14/2021 for hyponatremia and back pain   OT comments  Patient is a pleasant 88 year old female who was noted to have less pain on this date. Patient was able to transfer with min A +2 for safety with increased time and cues. Patient was noted to have HR increased to 130 bpm with transfer to recliner. Patient was noted to have HR range from 125-127 bpm sitting in recliner to complete grooming tasks with increased time. Patient would continue to benefit from skilled OT services at this time while admitted and after d/c to address noted deficits in order to improve overall safety and independence in ADLs.     Recommendations for follow up therapy are one component of a multi-disciplinary discharge planning process, led by the attending physician.  Recommendations may be updated based on patient status, additional functional criteria and insurance authorization.    Follow Up Recommendations  Skilled nursing-short term rehab (<3 hours/day)    Assistance Recommended at Discharge Frequent or constant Supervision/Assistance  Patient can return home with the following  A lot of help with walking and/or transfers;A lot of help with bathing/dressing/bathroom;Assist for transportation   Equipment Recommendations  Other (comment) (defer to next level of care.)    Recommendations for Other Services      Precautions / Restrictions Precautions Precautions: Fall Precaution Comments: T11 compression fx, monitor HR Restrictions Weight Bearing Restrictions: No       Mobility Bed Mobility                    Transfers                         Balance Overall balance assessment: History of Falls, Mild deficits  observed, not formally tested                                         ADL either performed or assessed with clinical judgement   ADL Overall ADL's : Needs assistance/impaired     Grooming: Wash/dry face;Wash/dry hands;Oral care;Sitting;Minimal assistance Grooming Details (indicate cue type and reason): in recliner with noted increased HR of 125-127 bpm seated tasks. patient also participated in shampoo cap seated in recliner with mod A to complete task.                               General ADL Comments: patient transfered from edge of bed where she was seated with nursing to recliner in room with 1x RW with min A and consistent cues for proper BLE movement. patient reported no pain initally but reported that she had a little discomfort prior to repositioning in recliner chair. patient was noted to have HR spike to 130 bpm with transfer.    Extremity/Trunk Assessment              Vision       Perception     Praxis      Cognition Arousal/Alertness: Awake/alert Behavior During Therapy: WFL for tasks assessed/performed Overall Cognitive Status: No family/caregiver present to determine baseline cognitive functioning  Exercises      Shoulder Instructions       General Comments      Pertinent Vitals/ Pain       Pain Assessment Pain Assessment: Faces Faces Pain Scale: Hurts a little bit Pain Location: back Pain Descriptors / Indicators: Discomfort Pain Intervention(s): Limited activity within patient's tolerance, Repositioned, Premedicated before session, Monitored during session  Home Living                                          Prior Functioning/Environment              Frequency  Min 3X/week        Progress Toward Goals  OT Goals(current goals can now be found in the care plan section)  Progress towards OT goals: Progressing toward goals      Plan Discharge plan remains appropriate    Co-evaluation                 AM-PAC OT "6 Clicks" Daily Activity     Outcome Measure   Help from another person eating meals?: A Little Help from another person taking care of personal grooming?: A Lot Help from another person toileting, which includes using toliet, bedpan, or urinal?: Total Help from another person bathing (including washing, rinsing, drying)?: A Lot Help from another person to put on and taking off regular upper body clothing?: Total Help from another person to put on and taking off regular lower body clothing?: A Lot 6 Click Score: 11    End of Session Equipment Utilized During Treatment: Gait belt;Rolling walker (2 wheels)  OT Visit Diagnosis: Muscle weakness (generalized) (M62.81);History of falling (Z91.81)   Activity Tolerance Patient tolerated treatment well;Patient limited by lethargy   Patient Left in chair;with call bell/phone within reach;with chair alarm set   Nurse Communication Mobility status        Time: [id number] OT Time Calculation (min): 24 min  Charges: OT General Charges $OT Visit: 1 Visit OT Treatments $Self Care/Home Management : 23-37 mins  Jackelyn Poling OTR/L, MS Acute Rehabilitation Department Office# [phone] Pager# [phone]   Marcellina Millin 03/13/2021, 11:14 AM

## 2021-03-13 NOTE — ED Provider Notes (Signed)
[facility]  Department of Emergency Medicine   Code Blue CONSULT NOTE  Chief Complaint: Cardiac arrest/unresponsive   Level V Caveat: Unresponsive  History of present illness: I was contacted by the hospital for a CODE BLUE cardiac arrest upstairs and presented to the patient's bedside.   CODE BLUE was initiated due to unresponsiveness. When I arrived to patient's room, I was informed that patient was a DNR/DNI. She appeared lethargic but breathing spontaneously. Medical team at bedside.    ROS: Unable to obtain, Level V caveat  Scheduled Meds:  aspirin EC  81 mg Oral Daily   buPROPion  75 mg Oral Daily   calcitonin (salmon)  1 spray Alternating Nares Daily   levothyroxine  12.5 mcg Oral Q0600   metoprolol tartrate  50 mg Oral BID   potassium chloride  20 mEq Oral BID   pravastatin  40 mg Oral QPM   senna  2 tablet Oral Daily   traZODone  25 mg Oral QHS   Continuous Infusions:  sodium chloride 10 mL/hr at 03/13/21 1258   PRN Meds:.acetaminophen **OR** [DISCONTINUED] acetaminophen, diltiazem, polyethylene glycol, traMADol Past Medical History:  Diagnosis Date   Hypertension    Thyroid disease    Vertigo    Past Surgical History:  Procedure Laterality Date   ABDOMINAL HYSTERECTOMY     APPENDECTOMY     HERNIA REPAIR     Social History   Socioeconomic History   Marital status: Married    Spouse name: Not on file   Number of children: Not on file   Years of education: Not on file   Highest education level: Not on file  Occupational History   Not on file  Tobacco Use   Smoking status: Never   Smokeless tobacco: Never  Vaping Use   Vaping Use: Never used  Substance and Sexual Activity   Alcohol use: No   Drug use: No   Sexual activity: Not on file  Other Topics Concern   Not on file  Social History Narrative   Not on file   Social Determinants of Health   Financial Resource Strain: Not on file  Food Insecurity: Not on file   Transportation Needs: Not on file  Physical Activity: Not on file  Stress: Not on file  Social Connections: Not on file  Intimate Partner Violence: Not on file   Allergies  Allergen Reactions   Citalopram    Demerol [Meperidine] Nausea And Vomiting and Other (See Comments)    Dizziness   Mirtazapine    Penicillins Hives    Has patient had a PCN reaction causing immediate rash, facial/tongue/throat swelling, SOB or lightheadedness with hypotension:No Has patient had a PCN reaction causing severe rash involving mucus membranes or skin necrosis:unsure Has patient had a PCN reaction that required hospitalization:No Has patient had a PCN reaction occurring within the last 10 years:No If all of the above answers are "NO", then may proceed with Cephalosporin use.    Sertraline     Last set of Vital Signs (not current) Vitals:   03/13/21 1513 03/13/21 2059  BP:  (!) 187/91  Pulse: (!) 102 (!) 110  Resp:    Temp:  99.4 F (37.4 C)  SpO2: 97% 98%      Physical Exam  Gen: very lethargic, minimally responsive  Cardiovascular: deferred Resp: slow respirations but spontaneously breathing Abd: nondistended  Neuro: lethargic, minimally responsive HEENT: unremakrable Musculoskeletal: No deformity  Skin: warm   Medical Decision making  CODE BLUE was initiated due to unresponsiveness. When I arrived to patient's room, I was informed that patient was a DNR/DNI. She appeared lethargic but breathing spontaneously. Medical team at bedside.  I verified in patient's chart DNR/DNI status.   Assessment and Plan   Declining responsiveness. Patient is DNR/DNI. Will defer further medical care to the primary medicine team.     Milagros Loll, MD 03/13/21 2133

## 2021-03-13 NOTE — TOC Progression Note (Signed)
Transition of Care [facility]) - Progression Note    Patient Details  Name: Candace Perkins MRN: [id number] Date of Birth: Jul 27, 1920  [facility]) CM/SW Contact  Artrell Lawless, Meriam Sprague, RN Phone Number: 03/13/2021, 1:06 PM  Clinical Narrative:    SNF bed offers provided to pt at bedside and to daughter Marylu Lund via phone. Marylu Lund to make decision and get back with TOC.    Expected Discharge Plan: Skilled Nursing Facility Barriers to Discharge: Continued Medical Work up  Expected Discharge Plan and Services Expected Discharge Plan: Skilled Nursing Facility   Discharge Planning Services: CM Consult   Living arrangements for the past 2 months: Assisted Living Facility                     Readmission Risk Interventions Readmission Risk Prevention Plan 03/12/2021  Transportation Screening Complete  PCP or Specialist Appt within 5-7 Days Complete  Home Care Screening Complete  Medication Review (RN CM) Complete  Some recent data might be hidden

## 2021-03-13 NOTE — Significant Event (Signed)
Rapid Response Event Note   Reason for Call :  Initially code blue called overhead. When arrived, learned patient had vital signs and was DNR. Patient was not responding to nurse.  Initial Focused Assessment:  Patient with tachycardia, hypertension, wheezes heard from upper airway, but not in lungs. Patient responsive to pain stimuli, not talking but does make purposeful movement.  Interventions:  Vitals rechecked. Provider reviewed chart.   Plan of Care:  Patient to remain in current assignment at this time. Provider will adjust meds and provide orders for hypertension and heart rate.   Selinda Michaels, RN

## 2021-03-13 NOTE — Plan of Care (Signed)
  Problem: Activity: Goal: Risk for activity intolerance will decrease Outcome: Progressing   Problem: Nutrition: Goal: Adequate nutrition will be maintained Outcome: Progressing   Problem: Safety: Goal: Ability to remain free from injury will improve Outcome: Progressing   

## 2021-03-14 DIAGNOSIS — R531 Weakness: Secondary | ICD-10-CM | POA: Diagnosis not present

## 2021-03-14 DIAGNOSIS — E871 Hypo-osmolality and hyponatremia: Secondary | ICD-10-CM | POA: Diagnosis not present

## 2021-03-14 DIAGNOSIS — I10 Essential (primary) hypertension: Secondary | ICD-10-CM | POA: Diagnosis not present

## 2021-03-14 DIAGNOSIS — S22080A Wedge compression fracture of T11-T12 vertebra, initial encounter for closed fracture: Secondary | ICD-10-CM | POA: Diagnosis not present

## 2021-03-14 LAB — BASIC METABOLIC PANEL
Anion gap: 9 (ref 5–15)
BUN: 23 mg/dL (ref 8–23)
CO2: 23 mmol/L (ref 22–32)
Calcium: 8.2 mg/dL — ABNORMAL LOW (ref 8.9–10.3)
Chloride: 98 mmol/L (ref 98–111)
Creatinine, Ser: 0.92 mg/dL (ref 0.44–1.00)
GFR, Estimated: 56 mL/min — ABNORMAL LOW (ref >60)
Glucose, Bld: 111 mg/dL — ABNORMAL HIGH (ref 70–99)
Potassium: 5.4 mmol/L — ABNORMAL HIGH (ref 3.5–5.1)
Sodium: 130 mmol/L — ABNORMAL LOW (ref 135–145)

## 2021-03-14 MED ORDER — MECLIZINE HCL 25 MG PO TABS
12.5000 mg | ORAL_TABLET | Freq: Three times a day (TID) | ORAL | Status: DC | PRN
  Administered 2021-03-14 – 2021-03-16 (×4): 12.5 mg via ORAL
  Filled 2021-03-14 (×4): qty 1

## 2021-03-14 MED ORDER — SODIUM CHLORIDE 0.9 % IV SOLN: INTRAVENOUS | Status: AC

## 2021-03-14 NOTE — TOC Progression Note (Signed)
Transition of Care [facility]) - Progression Note    Patient Details  Name: Candace Perkins MRN: [id number] Date of Birth: January 08, 1921  [facility] CM/SW Contact  Wilfrid Hyser, Marjie Skiff, RN Phone Number: 03/14/2021, 8:53 AM  Clinical Narrative:    Got a call from Paxton from [employer] last evening. She states that she spoke with pt daughter and they agree that the best thing for pt would be to come back to [place] instead of going to SNF. TOC will continue to follow.   Expected Discharge Plan: Skilled Nursing Facility Barriers to Discharge: Continued Medical Work up  Expected Discharge Plan and Services Expected Discharge Plan: [place]   Discharge Planning Services: CM Consult   Living arrangements for the past 2 months: [place]                   Readmission Risk Interventions Readmission Risk Prevention Plan 03/12/2021  Transportation Screening Complete  PCP or Specialist Appt within 5-7 Days Complete  Home Care Screening Complete  Medication Review (RN CM) Complete  Some recent data might be hidden

## 2021-03-14 NOTE — Progress Notes (Signed)
Patient is now alert and responding to commands. MD assessed patient at bedside. New orders have been placed. Will continue to monitor.  Sinclair Ship, RN

## 2021-03-14 NOTE — Progress Notes (Signed)
Obtained patient from the 6 th floor. Noted pt is very confused and is only aware of self. Pt does state "I am  having vertigo at present and needs a pill". VSS NOTED .Awaiting Dilt GTT from pharmacy .

## 2021-03-14 NOTE — Progress Notes (Signed)
°   03/13/21 2136  Assess: MEWS Score  BP (!) 179/110  Pulse Rate (!) 125  SpO2 99 %  Assess: MEWS Score  MEWS Temp 0  MEWS Systolic 0  MEWS Pulse 2  MEWS RR 0  MEWS LOC 0  MEWS Score 2  MEWS Score Color Yellow  Assess: if the MEWS score is Yellow or Red  Were vital signs taken at a resting state? Yes  Focused Assessment No change from prior assessment  Does the patient meet 2 or more of the SIRS criteria? No  MEWS guidelines implemented *See Row Information* Yes  Treat  MEWS Interventions Administered scheduled meds/treatments;Escalated (See documentation below);Consulted Respiratory Therapy  Pain Scale 0-10  Pain Score Asleep  Take Vital Signs  Increase Vital Sign Frequency  Red: Q 1hr X 4 then Q 4hr X 4, if remains red, continue Q 4hrs  Escalate  MEWS: Escalate Red: discuss with charge nurse/RN and provider, consider discussing with RRT  Notify: Charge Nurse/RN  Name of Charge Nurse/RN Notified Engineering geologist  Date Charge Nurse/RN Notified 03/14/21  Time Charge Nurse/RN Notified 2136  Notify: Provider  Provider Name/Title J. Garner Nash, NP  Date Provider Notified 03/13/21  Time Provider Notified 2136  Notification Type Call  Notification Reason Change in status  Provider response At bedside  Date of Provider Response 03/13/21  Time of Provider Response 2137  Notify: Rapid Response  Name of Rapid Response RN Notified Rapin RN  Date Rapid Response Notified 03/13/21  Time Rapid Response Notified 2136  Document  Patient Outcome Transferred/level of care increased  Progress note created (see row info) Yes  Assess: SIRS CRITERIA  SIRS Temperature  0  SIRS Pulse 1  SIRS Respirations  0  SIRS WBC 0  SIRS Score Sum  1

## 2021-03-14 NOTE — Progress Notes (Signed)
Noted Dilt gtt has been titrated up to 10/mg /hr with a noted response of heart rate of 80-90's.. Currently SB/P OF 87 WITH NOTED TRENDING DECREASE IN MAP PRESSURES. Dilt gtt decreased to 5mg  /hr. Noted as am Beta blocker is scheduled , gtt maybe able to be discontinued this am. Pt has been without fever through out the night. Pt is more cooperative and has taken PO meds through out the night. On going assessment

## 2021-03-14 NOTE — Progress Notes (Signed)
Charge notified about patient that remains unresponsive to commands. Charge nurse came to bedside to assess patient and vital signs were assessed. Rapid response notified of condition. Patient currently stable. No new orders at this time, will continue to monitor.  Layla Maw

## 2021-03-14 NOTE — Progress Notes (Addendum)
[facility] PROGRESS NOTE    Progress Note  Candace Perkins  [id number] DOB: Jan 17, 1921 DOA: 02/25/2021 PCP: Cari Caraway, MD     Brief Narrative:   Candace Perkins is an 88 y.o. female past medical history of hypothyroidism, chronic hyponatremia CVA who comes in from the ED with complaints of back pain and bilateral hand numbness she had a fall 2-week prior to admission and evaluation in the ED was found to have a T11 compression fracture she was also found to have a sodium of 122.  Sodium has been improving nicely, twelve-lead EKG done on 03/12/2021 showed atrial fibrillation her beta-blocker was resumed and her heart rate has improved.      Assessment/Plan:   Hypovolemic hyponatremia, superimposed on chronic with a baseline like around 1 30-133: She was started on IV fluids and her sodium is improving nicely. Continue IV fluids for 12 more hours,sodium has returned to baseline.  Mild hyperkalemia/hypokalemia:  Secondary to oral supplementation.  New onset A. fib with RVR: Heart rate controlled, initially was held due to hypotension.  Now transition to oral metoprolol. Patient not a candidate for anticoagulation due to advanced age.  Possibly left-sided weakness: MRI of the brain showed no acute findings. She had a previous CVA so this left-sided weakness probably chronic.  T11 compression fracture: Exact timing is unclear but images suggestive of acute patient not a candidate for intervention. Continue to use calcitonin nasally  Chronic kidney disease stage IIIa: Creatinine at baseline.  Hyperlipidemia: Continue medication.  Essential hypertension: Continue metoprolol. Hold diuretic therapy she may need to go home off diuretics.  Hypothyroidism: TSH  2.4 resume Synthroid at a lower dose in the setting of new onset A. fib with RVR can be increased as an outpatient..  History of CVA: Noted.   DVT prophylaxis: lovenox Family Communication:none Status  is: Inpatient  Remains inpatient appropriate because: New onset of A. fib with RVR probably back to skilled in 72 to 48 hours.   Code Status:     Code Status Orders  (From admission, onward)           Start     Ordered   03/11/21 0251  Do not attempt resuscitation (DNR)  Continuous       Question Answer Comment  In the event of cardiac or respiratory ARREST Do not call a code blue   In the event of cardiac or respiratory ARREST Do not perform Intubation, CPR, defibrillation or ACLS   In the event of cardiac or respiratory ARREST Use medication by any route, position, wound care, and other measures to relive pain and suffering. May use oxygen, suction and manual treatment of airway obstruction as needed for comfort.      03/11/21 0250           Code Status History     This patient has a current code status but no historical code status.      Advance Directive Documentation    Flowsheet Row Most Recent Value  Type of Advance Directive Out of facility DNR (pink MOST or yellow form)  Pre-existing out of facility DNR order (yellow form or pink MOST form) Yellow form placed in chart (order not valid for inpatient use)  "MOST" Form in Place? --         IV Access:   Peripheral IV   Procedures and diagnostic studies:   No results found.   Medical Consultants:   None.   Subjective:    Candace  S Perkins feels better  Objective:    Vitals:   03/14/21 0430 03/14/21 0500 03/14/21 0545 03/14/21 1159  BP: 131/81 104/61 (!) 87/51 125/67  Pulse: 89 92 86 92  Resp:      Temp:    98.1 F (36.7 C)  TempSrc:    Axillary  SpO2:   96% 99%  Weight:      Height:       SpO2: 99 %   Intake/Output Summary (Last 24 hours) at 03/14/2021 1249 Last data filed at 03/13/2021 1820 Gross per 24 hour  Intake 67.19 ml  Output 400 ml  Net -332.81 ml    Filed Weights   03/11/21 0128  Weight: 47.6 kg    Exam: General exam: In no acute distress,  cachectic Respiratory system: Good air movement and clear to auscultation. Cardiovascular system: S1 & S2 heard, RRR. No JVD. Gastrointestinal system: Abdomen is nondistended, soft and nontender.  Extremities: No pedal edema. Skin: No rashes, lesions or ulcers   Data Reviewed:    Labs: Basic Metabolic Panel: Recent Labs  Lab 03/11/21 0521 03/12/21 0532 03/13/21 0508 03/13/21 2259 03/14/21 0345  NA 122* 126* 129* 128* 130*  K 3.4* 3.4* 4.3 5.2* 5.4*  CL 85* 92* 98 98 98  CO2 29 25 24 22 23   GLUCOSE 77 89 83 120* 111*  BUN 16 10 16 20 23   CREATININE 0.89 0.73 0.84 0.89 0.92  CALCIUM 8.0* 8.0* 7.7* 8.1* 8.2*  MG 1.9 1.8  --   --   --   PHOS 3.1  --   --   --   --     GFR Estimated Creatinine Clearance: 25 mL/min (by C-G formula based on SCr of 0.92 mg/dL). Liver Function Tests: Recent Labs  Lab 03/14/2021 1928 03/11/21 0521 03/13/21 2259  AST 21 21 30   ALT 17 16 20   ALKPHOS 107 98 134*  BILITOT 1.0 1.2 1.1  PROT 6.3* 5.6* 6.2*  ALBUMIN 3.5 3.1* 3.1*    No results for input(s): LIPASE, AMYLASE in the last 168 hours. No results for input(s): AMMONIA in the last 168 hours. Coagulation profile No results for input(s): INR, PROTIME in the last 168 hours. COVID-19 Labs  No results for input(s): DDIMER, FERRITIN, LDH, CRP in the last 72 hours.  Lab Results  Component Value Date   [place] NEGATIVE 03/11/2021    CBC: Recent Labs  Lab 02/28/2021 1928 03/11/21 0521 03/12/21 0532 03/13/21 0508  WBC 8.8 7.4 11.9* 8.8  NEUTROABS  --  5.0  --   --   HGB 11.5* 10.7* 11.8* 10.7*  HCT 34.1* 32.4* 34.4* 32.4*  MCV 93.4 94.5 93.0 96.4  PLT 284 204 263 219    Cardiac Enzymes: Recent Labs  Lab 03/11/21 0521  CKTOTAL 31*    BNP (last 3 results) No results for input(s): PROBNP in the last 8760 hours. CBG: Recent Labs  Lab 03/13/21 2128  GLUCAP 129*   D-Dimer: No results for input(s): DDIMER in the last 72 hours. Hgb A1c: No results for input(s):  HGBA1C in the last 72 hours. Lipid Profile: No results for input(s): CHOL, HDL, LDLCALC, TRIG, CHOLHDL, LDLDIRECT in the last 72 hours. Thyroid function studies: Recent Labs    03/13/21 0508  TSH 2.461    Anemia work up: No results for input(s): VITAMINB12, FOLATE, FERRITIN, TIBC, IRON, RETICCTPCT in the last 72 hours. Sepsis Labs: Recent Labs  Lab 03/11/2021 1928 03/11/21 0521 03/12/21 0532 03/13/21 0508  WBC 8.8 7.4  11.9* 8.8    Microbiology Recent Results (from the past 240 hour(s))  Resp Panel by RT-PCR (Flu A&B, Covid) Nasopharyngeal Swab     Status: None   Collection Time: 03/11/21  1:00 AM   Specimen: Nasopharyngeal Swab; Nasopharyngeal(NP) swabs in vial transport medium  Result Value Ref Range Status   SARS Coronavirus 2 by RT PCR NEGATIVE NEGATIVE Final    Comment: (NOTE) SARS-CoV-2 target nucleic acids are NOT DETECTED.  The SARS-CoV-2 RNA is generally detectable in upper respiratory specimens during the acute phase of infection. The lowest concentration of SARS-CoV-2 viral copies this assay can detect is 138 copies/mL. A negative result does not preclude SARS-Cov-2 infection and should not be used as the sole basis for treatment or other patient management decisions. A negative result may occur with  improper specimen collection/handling, submission of specimen other than nasopharyngeal swab, presence of viral mutation(s) within the areas targeted by this assay, and inadequate number of viral copies(<138 copies/mL). A negative result must be combined with clinical observations, patient history, and epidemiological information. The expected result is Negative.  Fact Sheet for Patients:  EntrepreneurPulse.com.au  Fact Sheet for Healthcare Providers:  IncredibleEmployment.be  This test is no t yet approved or cleared by the [place] FDA and  has been authorized for detection and/or diagnosis of SARS-CoV-2 by FDA under  an Emergency Use Authorization (EUA). This EUA will remain  in effect (meaning this test can be used) for the duration of the COVID-19 declaration under Section 564(b)(1) of the Act, 21 U.S.C.section 360bbb-3(b)(1), unless the authorization is terminated  or revoked sooner.       Influenza A by PCR NEGATIVE NEGATIVE Final   Influenza B by PCR NEGATIVE NEGATIVE Final    Comment: (NOTE) The Xpert Xpress SARS-CoV-2/FLU/RSV plus assay is intended as an aid in the diagnosis of influenza from Nasopharyngeal swab specimens and should not be used as a sole basis for treatment. Nasal washings and aspirates are unacceptable for Xpert Xpress SARS-CoV-2/FLU/RSV testing.  Fact Sheet for Patients: EntrepreneurPulse.com.au  Fact Sheet for Healthcare Providers: IncredibleEmployment.be  This test is not yet approved or cleared by the [place] FDA and has been authorized for detection and/or diagnosis of SARS-CoV-2 by FDA under an Emergency Use Authorization (EUA). This EUA will remain in effect (meaning this test can be used) for the duration of the COVID-19 declaration under Section 564(b)(1) of the Act, 21 U.S.C. section 360bbb-3(b)(1), unless the authorization is terminated or revoked.  Performed at [facility], [place] [address] [place], [place] [address]      Medications:    aspirin EC  81 mg Oral Daily   buPROPion  75 mg Oral Daily   calcitonin (salmon)  1 spray Alternating Nares Daily   levothyroxine  12.5 mcg Oral Q0600   metoprolol tartrate  50 mg Oral BID   pravastatin  40 mg Oral QPM   senna  2 tablet Oral Daily   traZODone  25 mg Oral QHS   Continuous Infusions:  sodium chloride 10 mL/hr at 03/13/21 1258   diltiazem (CARDIZEM) infusion 5 mg/hr (03/14/21 0125)      LOS: 3 days   Charlynne Cousins  [facility]  03/14/2021, 12:49 PM

## 2021-03-14 NOTE — Progress Notes (Signed)
PT Cancellation Note  Patient Details Name: Candace Perkins MRN: [id number] DOB: 1920/10/04   Cancelled Treatment:    Reason Eval/Treat Not Completed: Fatigue/lethargy limiting ability to participate Pt not arousing to sternal rub, opening eyelid, or lifting arm.   RN notified and aware.   [name] 03/14/2021, 11:23 AM Paulino Door, DPT Acute Rehabilitation Services Pager: [phone] Office: [phone]

## 2021-03-14 NOTE — Progress Notes (Signed)
Daily morning medications not given due to patient not responding to commands. MD made aware, no new orders at this time. Will continue to monitor.   Layla Maw, RN

## 2021-03-14 NOTE — Progress Notes (Signed)
° ° °  OVERNIGHT PROGRESS REPORT  Originally called CODE BLUE overhead for this patient. Patient had not lost pulse had not lost respirations and is a DNR.  Patient was found to be tachycardic, low-grade fever, and will respond. Patient has refused p.o. medications earlier of which Cardizem PO had been ordered for new A. fib RVR  After administration of IV metoprolol, minimal improvement was noted and patient was in A. fib RVR at a rate of 130-150. Patient BP improved after Metoprolol.    Tylenol is ordered.  Patient is transferred to progressive with Cardizem drip.     Gershon Cull MSNA MSN ACNPC-AG Acute Care Nurse Practitioner [place]

## 2021-03-15 DIAGNOSIS — E871 Hypo-osmolality and hyponatremia: Secondary | ICD-10-CM | POA: Diagnosis not present

## 2021-03-15 DIAGNOSIS — I10 Essential (primary) hypertension: Secondary | ICD-10-CM | POA: Diagnosis not present

## 2021-03-15 DIAGNOSIS — S22080A Wedge compression fracture of T11-T12 vertebra, initial encounter for closed fracture: Secondary | ICD-10-CM | POA: Diagnosis not present

## 2021-03-15 DIAGNOSIS — E039 Hypothyroidism, unspecified: Secondary | ICD-10-CM | POA: Diagnosis not present

## 2021-03-15 LAB — BASIC METABOLIC PANEL
Anion gap: 7 (ref 5–15)
BUN: 32 mg/dL — ABNORMAL HIGH (ref 8–23)
CO2: 21 mmol/L — ABNORMAL LOW (ref 22–32)
Calcium: 7.7 mg/dL — ABNORMAL LOW (ref 8.9–10.3)
Chloride: 97 mmol/L — ABNORMAL LOW (ref 98–111)
Creatinine, Ser: 0.91 mg/dL (ref 0.44–1.00)
GFR, Estimated: 57 mL/min — ABNORMAL LOW (ref >60)
Glucose, Bld: 80 mg/dL (ref 70–99)
Potassium: 5.1 mmol/L (ref 3.5–5.1)
Sodium: 125 mmol/L — ABNORMAL LOW (ref 135–145)

## 2021-03-15 MED ORDER — SODIUM CHLORIDE 0.9 % IV SOLN
INTRAVENOUS | Status: AC
  Administered 2021-03-16: 75 mL/h via INTRAVENOUS

## 2021-03-15 MED ORDER — SODIUM CHLORIDE 0.9 % IV SOLN: INTRAVENOUS | Status: DC

## 2021-03-15 NOTE — Progress Notes (Signed)
[facility] PROGRESS NOTE    Progress Note  Candace Perkins  [id number] DOB: 1920-04-15 DOA: 02/26/2021 PCP: Cari Caraway, MD     Brief Narrative:   Candace Perkins is an 88 y.o. female past medical history of hypothyroidism, chronic hyponatremia CVA who comes in from the ED with complaints of back pain and bilateral hand numbness she had a fall 2-week prior to admission and evaluation in the ED was found to have a T11 compression fracture she was also found to have a sodium of 122.  Sodium has been improving nicely, twelve-lead EKG done on 03/12/2021 showed atrial fibrillation her beta-blocker was resumed and her heart rate has improved.     Assessment/Plan:   Hypovolemic hyponatremia, superimposed on chronic with a baseline like around 1 30-133: Fluids were KVO her sodium is going the wrong way restart normal saline recheck basic metabolic panel in the morning. Awaiting physical therapy evaluation, will probably need skilled nursing facility. If sodium is improved tomorrow can probably be discharged back to skilled nursing facility.  Mild hyperkalemia/hypokalemia:  Now improved after stopping oral supplementation.  New onset A. fib with RVR: Heart rate controlled, AV nodal blocking agent was initially  held due to hypotension.  Now transition to oral metoprolol. Patient not a candidate for anticoagulation due to advanced age.  Possibly left-sided weakness: MRI of the brain showed no acute findings. She had a previous CVA so this left-sided weakness probably chronic.  T11 compression fracture: Exact timing is unclear but images suggestive of acute patient not a candidate for intervention. Continue to use calcitonin nasally  Chronic kidney disease stage IIIa: Creatinine at baseline.  Hyperlipidemia: Continue medication.  Essential hypertension: Continue metoprolol. She will need to go home off diuretic therapy.  As she is having poor fluid  intake.  Hypothyroidism: TSH  2.4 resume Synthroid at a lower dose in the setting of new onset A. fib with RVR can be increased as an outpatient..  History of CVA: Noted.   DVT prophylaxis: lovenox Family Communication:none Status is: Inpatient  Remains inpatient appropriate because: New onset of A. fib with RVR probably back to skilled in 72 to 48 hours.   Code Status:     Code Status Orders  (From admission, onward)           Start     Ordered   03/11/21 0251  Do not attempt resuscitation (DNR)  Continuous       Question Answer Comment  In the event of cardiac or respiratory ARREST Do not call a code blue   In the event of cardiac or respiratory ARREST Do not perform Intubation, CPR, defibrillation or ACLS   In the event of cardiac or respiratory ARREST Use medication by any route, position, wound care, and other measures to relive pain and suffering. May use oxygen, suction and manual treatment of airway obstruction as needed for comfort.      03/11/21 0250           Code Status History     This patient has a current code status but no historical code status.      Advance Directive Documentation    Flowsheet Row Most Recent Value  Type of Advance Directive Out of facility DNR (pink MOST or yellow form)  Pre-existing out of facility DNR order (yellow form or pink MOST form) Yellow form placed in chart (order not valid for inpatient use)  "MOST" Form in Place? --  IV Access:   Peripheral IV   Procedures and diagnostic studies:   No results found.   Medical Consultants:   None.   Subjective:    Candace Perkins sleepy this morning.  Objective:    Vitals:   03/14/21 2000 03/14/21 2203 03/15/21 0100 03/15/21 0620  BP: 126/76 104/63 (!) 87/59 113/69  Pulse: 87 71 81 (!) 104  Resp:  16 18   Temp: 98.4 F (36.9 C) 97.8 F (36.6 C)    TempSrc: Oral Oral    SpO2: 97% 98% 98%   Weight:      Height:       SpO2: 98  %   Intake/Output Summary (Last 24 hours) at 03/15/2021 0731 Last data filed at 03/15/2021 [id number] Gross per 24 hour  Intake 1374.2 ml  Output 400 ml  Net 974.2 ml    Filed Weights   03/11/21 0128  Weight: 47.6 kg    Exam: General exam: In no acute distress. Respiratory system: Good air movement and clear to auscultation. Cardiovascular system: S1 & S2 heard, RRR. No JVD. Gastrointestinal system: Abdomen is nondistended, soft and nontender.  Extremities: No pedal edema. Skin: No rashes, lesions or ulcers  Data Reviewed:    Labs: Basic Metabolic Panel: Recent Labs  Lab 03/11/21 0521 03/12/21 0532 03/13/21 0508 03/13/21 2259 03/14/21 0345 03/15/21 0412  NA 122* 126* 129* 128* 130* 125*  K 3.4* 3.4* 4.3 5.2* 5.4* 5.1  CL 85* 92* 98 98 98 97*  CO2 29 25 24 22 23  21*  GLUCOSE 77 89 83 120* 111* 80  BUN 16 10 16 20 23  32*  CREATININE 0.89 0.73 0.84 0.89 0.92 0.91  CALCIUM 8.0* 8.0* 7.7* 8.1* 8.2* 7.7*  MG 1.9 1.8  --   --   --   --   PHOS 3.1  --   --   --   --   --     GFR Estimated Creatinine Clearance: 25.3 mL/min (by C-G formula based on SCr of 0.91 mg/dL). Liver Function Tests: Recent Labs  Lab 03/04/2021 1928 03/11/21 0521 03/13/21 2259  AST 21 21 30   ALT 17 16 20   ALKPHOS 107 98 134*  BILITOT 1.0 1.2 1.1  PROT 6.3* 5.6* 6.2*  ALBUMIN 3.5 3.1* 3.1*    No results for input(s): LIPASE, AMYLASE in the last 168 hours. No results for input(s): AMMONIA in the last 168 hours. Coagulation profile No results for input(s): INR, PROTIME in the last 168 hours. COVID-19 Labs  No results for input(s): DDIMER, FERRITIN, LDH, CRP in the last 72 hours.  Lab Results  Component Value Date   [place] NEGATIVE 03/11/2021    CBC: Recent Labs  Lab 03/01/2021 1928 03/11/21 0521 03/12/21 0532 03/13/21 0508  WBC 8.8 7.4 11.9* 8.8  NEUTROABS  --  5.0  --   --   HGB 11.5* 10.7* 11.8* 10.7*  HCT 34.1* 32.4* 34.4* 32.4*  MCV 93.4 94.5 93.0 96.4  PLT 284 204 263  219    Cardiac Enzymes: Recent Labs  Lab 03/11/21 0521  CKTOTAL 31*    BNP (last 3 results) No results for input(s): PROBNP in the last 8760 hours. CBG: Recent Labs  Lab 03/13/21 2128  GLUCAP 129*    D-Dimer: No results for input(s): DDIMER in the last 72 hours. Hgb A1c: No results for input(s): HGBA1C in the last 72 hours. Lipid Profile: No results for input(s): CHOL, HDL, LDLCALC, TRIG, CHOLHDL, LDLDIRECT in the last 72 hours. Thyroid  function studies: Recent Labs    03/13/21 0508  TSH 2.461    Anemia work up: No results for input(s): VITAMINB12, FOLATE, FERRITIN, TIBC, IRON, RETICCTPCT in the last 72 hours. Sepsis Labs: Recent Labs  Lab 03/12/2021 1928 03/11/21 0521 03/12/21 0532 03/13/21 0508  WBC 8.8 7.4 11.9* 8.8    Microbiology Recent Results (from the past 240 hour(s))  Resp Panel by RT-PCR (Flu A&B, Covid) Nasopharyngeal Swab     Status: None   Collection Time: 03/11/21  1:00 AM   Specimen: Nasopharyngeal Swab; Nasopharyngeal(NP) swabs in vial transport medium  Result Value Ref Range Status   SARS Coronavirus 2 by RT PCR NEGATIVE NEGATIVE Final    Comment: (NOTE) SARS-CoV-2 target nucleic acids are NOT DETECTED.  The SARS-CoV-2 RNA is generally detectable in upper respiratory specimens during the acute phase of infection. The lowest concentration of SARS-CoV-2 viral copies this assay can detect is 138 copies/mL. A negative result does not preclude SARS-Cov-2 infection and should not be used as the sole basis for treatment or other patient management decisions. A negative result may occur with  improper specimen collection/handling, submission of specimen other than nasopharyngeal swab, presence of viral mutation(s) within the areas targeted by this assay, and inadequate number of viral copies(<138 copies/mL). A negative result must be combined with clinical observations, patient history, and epidemiological information. The expected result is  Negative.  Fact Sheet for Patients:  EntrepreneurPulse.com.au  Fact Sheet for Healthcare Providers:  IncredibleEmployment.be  This test is no t yet approved or cleared by the [place] FDA and  has been authorized for detection and/or diagnosis of SARS-CoV-2 by FDA under an Emergency Use Authorization (EUA). This EUA will remain  in effect (meaning this test can be used) for the duration of the COVID-19 declaration under Section 564(b)(1) of the Act, 21 U.S.C.section 360bbb-3(b)(1), unless the authorization is terminated  or revoked sooner.       Influenza A by PCR NEGATIVE NEGATIVE Final   Influenza B by PCR NEGATIVE NEGATIVE Final    Comment: (NOTE) The Xpert Xpress SARS-CoV-2/FLU/RSV plus assay is intended as an aid in the diagnosis of influenza from Nasopharyngeal swab specimens and should not be used as a sole basis for treatment. Nasal washings and aspirates are unacceptable for Xpert Xpress SARS-CoV-2/FLU/RSV testing.  Fact Sheet for Patients: EntrepreneurPulse.com.au  Fact Sheet for Healthcare Providers: IncredibleEmployment.be  This test is not yet approved or cleared by the [place] FDA and has been authorized for detection and/or diagnosis of SARS-CoV-2 by FDA under an Emergency Use Authorization (EUA). This EUA will remain in effect (meaning this test can be used) for the duration of the COVID-19 declaration under Section 564(b)(1) of the Act, 21 U.S.C. section 360bbb-3(b)(1), unless the authorization is terminated or revoked.  Performed at [facility], [place] [address] [place], [place] [address]      Medications:    aspirin EC  81 mg Oral Daily   buPROPion  75 mg Oral Daily   calcitonin (salmon)  1 spray Alternating Nares Daily   levothyroxine  12.5 mcg Oral Q0600   metoprolol tartrate  50 mg Oral BID   pravastatin  40 mg Oral QPM   senna  2 tablet Oral  Daily   traZODone  25 mg Oral QHS   Continuous Infusions:  sodium chloride 10 mL/hr at 03/13/21 1258      LOS: 4 days   Charlynne Cousins  [facility]  03/15/2021, 7:31 AM

## 2021-03-15 NOTE — Progress Notes (Signed)
OT Cancellation Note  Patient Details Name: Candace Perkins MRN: [id number] DOB: Aug 30, 1920   Cancelled Treatment:    Reason Eval/Treat Not Completed: Fatigue/lethargy limiting ability to participate. Called out patient's name, rubbed arm and chest with patient not opening eyes/responding. Notified RN.   Marlyce Huge OT OT pager: [phone]   Carmelia Roller 03/15/2021, 11:19 AM

## 2021-03-16 DIAGNOSIS — E871 Hypo-osmolality and hyponatremia: Secondary | ICD-10-CM | POA: Diagnosis not present

## 2021-03-16 LAB — BASIC METABOLIC PANEL
Anion gap: 7 (ref 5–15)
BUN: 32 mg/dL — ABNORMAL HIGH (ref 8–23)
CO2: 18 mmol/L — ABNORMAL LOW (ref 22–32)
Calcium: 6.9 mg/dL — ABNORMAL LOW (ref 8.9–10.3)
Chloride: 105 mmol/L (ref 98–111)
Creatinine, Ser: 0.71 mg/dL (ref 0.44–1.00)
GFR, Estimated: >60 mL/min (ref >60)
Glucose, Bld: 78 mg/dL (ref 70–99)
Potassium: 4.1 mmol/L (ref 3.5–5.1)
Sodium: 130 mmol/L — ABNORMAL LOW (ref 135–145)

## 2021-03-16 MED ORDER — MELATONIN 3 MG PO TABS: 3.0000 mg | ORAL_TABLET | Freq: Every day | ORAL | Status: DC

## 2021-03-16 MED ORDER — HALOPERIDOL LACTATE 5 MG/ML IJ SOLN: 1.0000 mg | Freq: Four times a day (QID) | INTRAMUSCULAR | Status: DC | PRN

## 2021-03-17 NOTE — Discharge Summary (Deleted)
Death Summary  [name] [id number] DOB: [birth year] DOA: 03-27-21  PCP: Candace Caraway, MD  Admit date: Mar 27, 2021 Date of Death: 15-Apr-2021 Time of Death: 04/14/21 around 2 PM Notification: Candace Caraway, MD notified of death of 15-Apr-2021   History of present illness:  Candace Perkins is a 88 y.o. female 88 y.o. female past medical history of hypothyroidism, chronic hyponatremia CVA who comes in from the ED with complaints of back pain and bilateral hand numbness she had a fall 2-week prior to admission and evaluation in the ED was found to have a T11 compression fracture she was also found to have a sodium of 122.  Sodium has been improving nicely, twelve-lead EKG done on 03/12/2021 showed atrial fibrillation her beta-blocker was resumed and her heart rate has improved.  On the day of that she was eating and started to throw up, she was placed on oxygen and later on when the nurse came in she became agonal her breathing stopped and she was pronounced.  Final Diagnoses:  1.   Hypovolemic hyponatremia superimposed on chronic with a baseline of 1 30-1 33: Is improved with fluid resuscitation.  Mild hyperkalemia/hypokalemia: Treated.  New onset atrial fibrillation: She was not a candidate for anticoagulation due to advanced age and risk of fall. She was started on metoprolol and she remained rate controlled.  T11 compression fracture: Exact time it remains unclear patient is not a candidate for intervention she was started on calcitonin nasally.  Chronic kidney stage IIIa: His creatinine is at baseline.  Hyperlipidemia: Hypothyroidism History of CVA  The results of significant diagnostics from this hospitalization (including imaging, microbiology, ancillary and laboratory) are listed below for reference.    Significant Diagnostic Studies: DG Thoracic Spine 2 View  Result Date: 03/13/2021 CLINICAL DATA:  Back pain EXAM: THORACIC SPINE 2 VIEWS COMPARISON:  CT 03/11/2021  FINDINGS: Scoliosis. Moderate compression fracture, appears to be at the level of T12 when counting from above on AP view. This would suggest transitional anatomy of the lumbar spine. IMPRESSION: Moderate age indeterminate compression fracture what appears to be T12 level. Suggest correlation with thoracic spine CT Electronically Signed   By: Donavan Foil M.D.   On: 03/14/2021 23:36   DG Lumbar Spine Complete  Result Date: Mar 27, 2021 CLINICAL DATA:  Fall, back pain EXAM: LUMBAR SPINE - COMPLETE 4+ VIEW COMPARISON:  None. FINDINGS: Scoliosis and diffuse degenerative disc and facet disease throughout the lumbar spine. No fracture or subluxation. SI joints symmetric and unremarkable. Diffuse aortoiliac atherosclerosis. No aneurysm. IMPRESSION: Scoliosis and degenerative changes.  No acute bony abnormality. Electronically Signed   By: Rolm Baptise M.D.   On: 03-27-21 21:29   DG Abd 1 View  Result Date: 03/11/2021 CLINICAL DATA:  Hyponatremia, back pain EXAM: ABDOMEN - 1 VIEW COMPARISON:  None. FINDINGS: Evaluation of the right flank and right upper quadrant is limited by over penetration. Pelvis excluded from view. Nonobstructive abdominal gas pattern. No gross free intraperitoneal gas. Displacement of bowel from the epigastrium may relate to hepatomegaly or gastric distension, but is not well characterized on this exam. Small left pleural effusion noted. Vascular calcifications noted within the abdominal aorta. Osseous structures are age-appropriate. IMPRESSION: Displacement of bowel from the epigastric region possibly related to mass effect related to organomegaly or gastric distension. This may be better assessed with sonography or CT imaging if indicated. Electronically Signed   By: Fidela Salisbury M.D.   On: 03/11/2021 02:08   CT Head Wo Contrast  Result Date:  03/05/2021 CLINICAL DATA:  Neuro deficit, acute, stroke suspected. Fall 2 weeks ago. EXAM: CT HEAD WITHOUT CONTRAST TECHNIQUE: Contiguous axial  images were obtained from the base of the skull through the vertex without intravenous contrast. RADIATION DOSE REDUCTION: This exam was performed according to the departmental dose-optimization program which includes automated exposure control, adjustment of the mA and/or kV according to patient size and/or use of iterative reconstruction technique. COMPARISON:  02/26/2021 FINDINGS: Brain: Bilateral basal ganglia physiologic calcifications. There is atrophy and chronic small vessel disease changes. No acute intracranial abnormality. Specifically, no hemorrhage, hydrocephalus, mass lesion, acute infarction, or significant intracranial injury. Vascular: No hyperdense vessel or unexpected calcification. Skull: No acute calvarial abnormality. Sinuses/Orbits: Air-fluid level in the left maxillary sinus is stable since prior study. Other: None IMPRESSION: Atrophy, chronic microvascular disease. No acute intracranial abnormality. Electronically Signed   By: Rolm Baptise M.D.   On: 02/19/2021 22:48   CT Head Wo Contrast  Result Date: 02/26/2021 CLINICAL DATA:  Head trauma, minor (Age >= 65y); Neck trauma (Age >= 65y), fall EXAM: CT HEAD WITHOUT CONTRAST CT CERVICAL SPINE WITHOUT CONTRAST TECHNIQUE: Multidetector CT imaging of the head and cervical spine was performed following the standard protocol without intravenous contrast. Multiplanar CT image reconstructions of the cervical spine were also generated. RADIATION DOSE REDUCTION: This exam was performed according to the departmental dose-optimization program which includes automated exposure control, adjustment of the mA and/or kV according to patient size and/or use of iterative reconstruction technique. COMPARISON:  MRI head 05/13/2012, CT head 08/24/2015 FINDINGS: CT HEAD FINDINGS BRAIN: BRAIN Cerebral ventricle sizes are concordant with the degree of cerebral volume loss. Patchy and confluent areas of decreased attenuation are noted throughout the deep and  periventricular white matter of the cerebral hemispheres bilaterally, compatible with chronic microvascular ischemic disease. No evidence of large-territorial acute infarction. No parenchymal hemorrhage. No mass lesion. No extra-axial collection. No mass effect or midline shift. No hydrocephalus. Basilar cisterns are patent. Vascular: No hyperdense vessel. Skull: No acute fracture or focal lesion. Sinuses/Orbits: Paranasal sinuses and mastoid air cells are clear. Bilateral lens replacement. Otherwise the orbits are unremarkable. Other: None. CT CERVICAL SPINE FINDINGS Alignment: Normal. Skull base and vertebrae: Multilevel severe degenerative changes of the spine. No acute fracture. No aggressive appearing focal osseous lesion or focal pathologic process. Soft tissues and spinal canal: No prevertebral fluid or swelling. No visible canal hematoma. Upper chest: Biapical pleural/pulmonary scarring. Paraseptal emphysematous changes. Other: Atherosclerotic plaque of the carotid artery within the neck. IMPRESSION: 1.  No acute intracranial abnormality. 2. No acute displaced fracture or traumatic listhesis of the cervical spine. Electronically Signed   By: Iven Finn M.D.   On: 02/26/2021 18:36   CT Cervical Spine Wo Contrast  Result Date: 02/26/2021 CLINICAL DATA:  Head trauma, minor (Age >= 65y); Neck trauma (Age >= 65y), fall EXAM: CT HEAD WITHOUT CONTRAST CT CERVICAL SPINE WITHOUT CONTRAST TECHNIQUE: Multidetector CT imaging of the head and cervical spine was performed following the standard protocol without intravenous contrast. Multiplanar CT image reconstructions of the cervical spine were also generated. RADIATION DOSE REDUCTION: This exam was performed according to the departmental dose-optimization program which includes automated exposure control, adjustment of the mA and/or kV according to patient size and/or use of iterative reconstruction technique. COMPARISON:  MRI head 05/13/2012, CT head  08/24/2015 FINDINGS: CT HEAD FINDINGS BRAIN: BRAIN Cerebral ventricle sizes are concordant with the degree of cerebral volume loss. Patchy and confluent areas of decreased attenuation are noted throughout the deep  and periventricular white matter of the cerebral hemispheres bilaterally, compatible with chronic microvascular ischemic disease. No evidence of large-territorial acute infarction. No parenchymal hemorrhage. No mass lesion. No extra-axial collection. No mass effect or midline shift. No hydrocephalus. Basilar cisterns are patent. Vascular: No hyperdense vessel. Skull: No acute fracture or focal lesion. Sinuses/Orbits: Paranasal sinuses and mastoid air cells are clear. Bilateral lens replacement. Otherwise the orbits are unremarkable. Other: None. CT CERVICAL SPINE FINDINGS Alignment: Normal. Skull base and vertebrae: Multilevel severe degenerative changes of the spine. No acute fracture. No aggressive appearing focal osseous lesion or focal pathologic process. Soft tissues and spinal canal: No prevertebral fluid or swelling. No visible canal hematoma. Upper chest: Biapical pleural/pulmonary scarring. Paraseptal emphysematous changes. Other: Atherosclerotic plaque of the carotid artery within the neck. IMPRESSION: 1.  No acute intracranial abnormality. 2. No acute displaced fracture or traumatic listhesis of the cervical spine. Electronically Signed   By: Iven Finn M.D.   On: 02/26/2021 18:36   CT Lumbar Spine Wo Contrast  Result Date: 03/14/2021 CLINICAL DATA:  Initial evaluation for acute low back pain, increased fracture risk. EXAM: CT LUMBAR SPINE WITHOUT CONTRAST TECHNIQUE: Multidetector CT imaging of the lumbar spine was performed without intravenous contrast administration. Multiplanar CT image reconstructions were also generated. RADIATION DOSE REDUCTION: This exam was performed according to the departmental dose-optimization program which includes automated exposure control, adjustment of  the mA and/or kV according to patient size and/or use of iterative reconstruction technique. COMPARISON:  Prior radiograph from 02/26/2021. FINDINGS: Segmentation: Standard. Lowest well-formed disc space labeled the L5-S1 level. Alignment: Sigmoid scoliotic curvature of the visualized thoracolumbar spine. Trace retrolisthesis of T12 on L1 and L1 on L2, with mild grade 1 anterolisthesis of L4 on L5 and L5 on S1. Findings chronic and facet mediated. Vertebrae: Mild height loss with age-indeterminate lucency seen extending through the partially visualized inferior endplate of [id number] (series 8, image 28). Finding is incompletely assessed on this exam. Otherwise, vertebral body height maintained with no other acute or chronic fracture. Visualized sacrum and pelvis intact. No discrete or worrisome osseous lesions. Visualized lower ribs intact. Paraspinal and other soft tissues: Paraspinous soft tissues demonstrate no acute finding. Small layering bilateral pleural effusions partially visualized. Advanced aorto bi-iliac atherosclerotic disease. Disc levels: T12-L1: Degenerative intervertebral disc space narrowing with disc desiccation and mild disc bulge. Reactive endplate spurring. Mild facet hypertrophy. No stenosis. L1-2: Degenerative intervertebral disc space narrowing with diffuse disc bulge and disc desiccation. Reactive endplate spurring. Mild facet hypertrophy. No spinal stenosis. Foramina remain patent. L2-3: Degenerative intervertebral disc space narrowing with disc desiccation and diffuse disc bulge. Moderate left worse than right facet hypertrophy. Probable mild narrowing of the left lateral recess. Central canal remains patent. No significant foraminal encroachment. L3-4: Diffuse disc bulge with disc desiccation. Superimposed broad-based left foraminal to extraforaminal disc protrusion (series 4, image 59). Moderate bilateral facet hypertrophy. Mild narrowing of the lateral recesses bilaterally. Central canal  remains patent. Probable mild bilateral L3 foraminal stenosis. L4-5: Trace anterolisthesis with degenerative intervertebral disc space narrowing. Mild diffuse disc bulge. Severe right with moderate left facet arthrosis. No significant spinal stenosis. Foramina remain patent. L5-S1: Trace anterolisthesis. No significant disc bulge. Moderate bilateral facet hypertrophy. No stenosis. IMPRESSION: 1. Mild height loss with age-indeterminate lucency extending through the partially visualized inferior endplate of 624THL. Correlation with physical exam for possible pain at this location recommended. Additionally, further assessment with dedicated CT of the thoracic spine could be performed for further evaluation as warranted. 2. No other  acute traumatic injury within the lumbar spine. 3. Sigmoid scoliotic curvature with multilevel degenerative spondylolysis and facet arthrosis as above. No high-grade spinal stenosis. 4. Small layering bilateral pleural effusions, partially visualized. 5. Aortic Atherosclerosis (ICD10-I70.0). Electronically Signed   By: Jeannine Boga M.D.   On: 03/02/2021 22:52   MR BRAIN WO CONTRAST  Result Date: 03/11/2021 CLINICAL DATA:  Neuro deficit, acute, stroke suspected EXAM: MRI HEAD WITHOUT CONTRAST TECHNIQUE: Multiplanar, multiecho pulse sequences of the brain and surrounding structures were obtained without intravenous contrast. COMPARISON:  2014 FINDINGS: Brain: There is no acute infarction or intracranial hemorrhage. There is no intracranial mass, mass effect, or edema. There is no hydrocephalus or extra-axial fluid collection. Ventricles and sulci are enlarged reflecting parenchymal volume loss patchy and confluent areas of T2 hyperintensity in the supratentorial white matter are nonspecific but may reflect chronic microvascular ischemic changes. There are chronic small vessel infarcts of the right corona radiata and left caudate. Small bilateral chronic cerebellar infarcts are again  identified. Vascular: Major vessel flow voids at the skull base are preserved. Skull and upper cervical spine: Normal marrow signal is preserved. Sinuses/Orbits: Paranasal sinuses are aerated. Orbits are unremarkable. Other: Sella is unremarkable.  Mastoid air cells are clear. IMPRESSION: No acute infarction, hemorrhage, or mass. Parenchymal volume loss and chronic microvascular ischemic changes, which have progressed since 2014. Electronically Signed   By: Macy Mis M.D.   On: 03/11/2021 13:38   CT ABDOMEN PELVIS W CONTRAST  Result Date: 03/11/2021 CLINICAL DATA:  Abdominal pain, acute, nonlocalized. EXAM: CT ABDOMEN AND PELVIS WITH CONTRAST TECHNIQUE: Multidetector CT imaging of the abdomen and pelvis was performed using the standard protocol following bolus administration of intravenous contrast. RADIATION DOSE REDUCTION: This exam was performed according to the departmental dose-optimization program which includes automated exposure control, adjustment of the mA and/or kV according to patient size and/or use of iterative reconstruction technique. CONTRAST:  85mL OMNIPAQUE IOHEXOL 300 MG/ML  SOLN COMPARISON:  Radiography same day FINDINGS: Lower chest: Mild scarring at the lung bases. Bilateral pleural effusions layering dependently. Mild cardiomegaly. Hepatobiliary: Liver parenchyma is normal. Few small stones dependent within the gallbladder. No visible ductal stone. Mild ductal prominence, often seen at this age. The gallstones are numerous and small in could pass into the ductal system. If there is clinical concern regarding a ductal stone, MRCP would be suggested. Pancreas: Normal Spleen: Normal Adrenals/Urinary Tract: Adrenal glands are normal. Kidneys are normal. Bladder is full but normal. Stomach/Bowel: Stomach, small intestine and colon all appear normal. Vascular/Lymphatic: Aortic atherosclerosis. No aneurysm. IVC is normal. No adenopathy. Reproductive: Previous hysterectomy.  No pelvic mass.  Other: Previous anterior abdominal wall sutures. No evidence of hernia. There is a T11 compression fracture with loss of height of 50%. Mild posterior bowing of the posterior margin of the vertebral body. This may be a recent fracture. IMPRESSION: Numerous small stones dependent in the gallbladder. Mild prominence of the gallbladder and biliary ductal system, without identifiable ductal stone. If there is concern regarding acute biliary disease, consider ultrasound or nuclear medicine hepatobiliary scan if the concern is that of cholecystitis or consider MRCP if the concern is that of choledocholithiasis. Bilateral pleural effusions layering dependently with mild dependent atelectasis. Aortic Atherosclerosis (ICD10-I70.0). T11 compression fracture with loss of height of 50%. The exact age is indeterminate but I suspect this is a recent fracture Electronically Signed   By: Nelson Chimes M.D.   On: 03/11/2021 10:47   DG CHEST PORT 1 VIEW  Result Date:  03/11/2021 CLINICAL DATA:  Hyponatremia. EXAM: PORTABLE CHEST 1 VIEW COMPARISON:  Chest radiograph dated 05/13/2012 FINDINGS: Small left pleural effusion and left lung base atelectasis. No pneumothorax. Stable cardiac silhouette. Atherosclerotic calcification of the aorta. No acute osseous pathology. IMPRESSION: Small left pleural effusion and left lung base atelectasis. Electronically Signed   By: Anner Crete M.D.   On: 03/11/2021 02:05    Microbiology: Recent Results (from the past 240 hour(s))  Resp Panel by RT-PCR (Flu A&B, Covid) Nasopharyngeal Swab     Status: None   Collection Time: 03/11/21  1:00 AM   Specimen: Nasopharyngeal Swab; Nasopharyngeal(NP) swabs in vial transport medium  Result Value Ref Range Status   SARS Coronavirus 2 by RT PCR NEGATIVE NEGATIVE Final    Comment: (NOTE) SARS-CoV-2 target nucleic acids are NOT DETECTED.  The SARS-CoV-2 RNA is generally detectable in upper respiratory specimens during the acute phase of infection.  The lowest concentration of SARS-CoV-2 viral copies this assay can detect is 138 copies/mL. A negative result does not preclude SARS-Cov-2 infection and should not be used as the sole basis for treatment or other patient management decisions. A negative result may occur with  improper specimen collection/handling, submission of specimen other than nasopharyngeal swab, presence of viral mutation(s) within the areas targeted by this assay, and inadequate number of viral copies(<138 copies/mL). A negative result must be combined with clinical observations, patient history, and epidemiological information. The expected result is Negative.  Fact Sheet for Patients:  EntrepreneurPulse.com.au  Fact Sheet for Healthcare Providers:  IncredibleEmployment.be  This test is no t yet approved or cleared by the [place] FDA and  has been authorized for detection and/or diagnosis of SARS-CoV-2 by FDA under an Emergency Use Authorization (EUA). This EUA will remain  in effect (meaning this test can be used) for the duration of the COVID-19 declaration under Section 564(b)(1) of the Act, 21 U.S.C.section 360bbb-3(b)(1), unless the authorization is terminated  or revoked sooner.       Influenza A by PCR NEGATIVE NEGATIVE Final   Influenza B by PCR NEGATIVE NEGATIVE Final    Comment: (NOTE) The Xpert Xpress SARS-CoV-2/FLU/RSV plus assay is intended as an aid in the diagnosis of influenza from Nasopharyngeal swab specimens and should not be used as a sole basis for treatment. Nasal washings and aspirates are unacceptable for Xpert Xpress SARS-CoV-2/FLU/RSV testing.  Fact Sheet for Patients: EntrepreneurPulse.com.au  Fact Sheet for Healthcare Providers: IncredibleEmployment.be  This test is not yet approved or cleared by the [place] FDA and has been authorized for detection and/or diagnosis of SARS-CoV-2 by FDA  under an Emergency Use Authorization (EUA). This EUA will remain in effect (meaning this test can be used) for the duration of the COVID-19 declaration under Section 564(b)(1) of the Act, 21 U.S.C. section 360bbb-3(b)(1), unless the authorization is terminated or revoked.  Performed at [facility], [place] [address], [place], [place] [address]      Labs: Basic Metabolic Panel: Recent Labs  Lab 03/11/21 0521 03/12/21 0532 03/13/21 0508 03/13/21 2259 03/14/21 0345 03/15/21 0412 April 03, 2021 0408  NA 122* 126* 129* 128* 130* 125* 130*  K 3.4* 3.4* 4.3 5.2* 5.4* 5.1 4.1  CL 85* 92* 98 98 98 97* 105  CO2 29 25 24 22 23  21* 18*  GLUCOSE 77 89 83 120* 111* 80 78  BUN 16 10 16 20 23  32* 32*  CREATININE 0.89 0.73 0.84 0.89 0.92 0.91 0.71  CALCIUM 8.0* 8.0* 7.7* 8.1* 8.2* 7.7* 6.9*  MG 1.9 1.8  --   --   --   --   --   PHOS 3.1  --   --   --   --   --   --    Liver Function Tests: Recent Labs  Lab 02/23/2021 1928 03/11/21 0521 03/13/21 2259  AST 21 21 30   ALT 17 16 20   ALKPHOS 107 98 134*  BILITOT 1.0 1.2 1.1  PROT 6.3* 5.6* 6.2*  ALBUMIN 3.5 3.1* 3.1*   No results for input(s): LIPASE, AMYLASE in the last 168 hours. No results for input(s): AMMONIA in the last 168 hours. CBC: Recent Labs  Lab 03/15/2021 1928 03/11/21 0521 03/12/21 0532 03/13/21 0508  WBC 8.8 7.4 11.9* 8.8  NEUTROABS  --  5.0  --   --   HGB 11.5* 10.7* 11.8* 10.7*  HCT 34.1* 32.4* 34.4* 32.4*  MCV 93.4 94.5 93.0 96.4  PLT 284 204 263 219   Cardiac Enzymes: Recent Labs  Lab 03/11/21 0521  CKTOTAL 31*   D-Dimer No results for input(s): DDIMER in the last 72 hours. BNP: Invalid input(s): POCBNP CBG: Recent Labs  Lab 03/13/21 2128  GLUCAP 129*   Anemia work up No results for input(s): VITAMINB12, FOLATE, FERRITIN, TIBC, IRON, RETICCTPCT in the last 72 hours. Urinalysis    Component Value Date/Time   COLORURINE YELLOW 02/20/2021 1954   APPEARANCEUR CLEAR 02/20/2021 1954    LABSPEC 1.010 03/01/2021 1954   PHURINE 7.0 03/03/2021 1954   GLUCOSEU NEGATIVE 03/09/2021 1954   HGBUR NEGATIVE 03/03/2021 1954   BILIRUBINUR NEGATIVE 03/19/2021 1954   KETONESUR 20 (A) 03/18/2021 1954   PROTEINUR NEGATIVE 02/23/2021 1954   UROBILINOGEN 0.2 05/13/2012 1521   NITRITE NEGATIVE 02/20/2021 1954   LEUKOCYTESUR NEGATIVE 03/02/2021 1954   Sepsis Labs Invalid input(s): PROCALCITONIN,  WBC,  LACTICIDVEN     SIGNED:  Charlynne Cousins, MD  [facility] 03/17/2021, 9:52 AM Pager   If 7PM-7AM, please contact night-coverage [email or web address] Password [email or web address]

## 2021-03-17 NOTE — Discharge Summary (Deleted)
Death Summary  Candace Perkins [id number] DOB: [birth year] DOA: 03-27-21  PCP: Cari Caraway, MD  Admit date: Mar 27, 2021 Date of Death: 15-Apr-2021 Time of Death: 04/14/21 around 2 PM Notification: Cari Caraway, MD notified of death of 15-Apr-2021   History of present illness:  Candace Perkins is a 88 y.o. female 88 y.o. female past medical history of hypothyroidism, chronic hyponatremia CVA who comes in from the ED with complaints of back pain and bilateral hand numbness she had a fall 2-week prior to admission and evaluation in the ED was found to have a T11 compression fracture she was also found to have a sodium of 122.  Sodium has been improving nicely, twelve-lead EKG done on 03/12/2021 showed atrial fibrillation her beta-blocker was resumed and her heart rate has improved.  On the day of that she was eating and started to throw up, she was placed on oxygen and later on when the nurse came in she became agonal her breathing stopped and she was pronounced.  Final Diagnoses:  1.   Hypovolemic hyponatremia superimposed on chronic with a baseline of 1 30-1 33: Is improved with fluid resuscitation.  Mild hyperkalemia/hypokalemia: Treated.  New onset atrial fibrillation: She was not a candidate for anticoagulation due to advanced age and risk of fall. She was started on metoprolol and she remained rate controlled.  T11 compression fracture: Exact time it remains unclear patient is not a candidate for intervention she was started on calcitonin nasally.  Chronic kidney stage IIIa: His creatinine is at baseline.  Hyperlipidemia: Hypothyroidism History of CVA  The results of significant diagnostics from this hospitalization (including imaging, microbiology, ancillary and laboratory) are listed below for reference.    Significant Diagnostic Studies: DG Thoracic Spine 2 View  Result Date: 03/13/2021 CLINICAL DATA:  Back pain EXAM: THORACIC SPINE 2 VIEWS COMPARISON:  CT 03/11/2021  FINDINGS: Scoliosis. Moderate compression fracture, appears to be at the level of T12 when counting from above on AP view. This would suggest transitional anatomy of the lumbar spine. IMPRESSION: Moderate age indeterminate compression fracture what appears to be T12 level. Suggest correlation with thoracic spine CT Electronically Signed   By: Donavan Foil M.D.   On: 03/14/2021 23:36   DG Lumbar Spine Complete  Result Date: Mar 27, 2021 CLINICAL DATA:  Fall, back pain EXAM: LUMBAR SPINE - COMPLETE 4+ VIEW COMPARISON:  None. FINDINGS: Scoliosis and diffuse degenerative disc and facet disease throughout the lumbar spine. No fracture or subluxation. SI joints symmetric and unremarkable. Diffuse aortoiliac atherosclerosis. No aneurysm. IMPRESSION: Scoliosis and degenerative changes.  No acute bony abnormality. Electronically Signed   By: Rolm Baptise M.D.   On: 03-27-21 21:29   DG Abd 1 View  Result Date: 03/11/2021 CLINICAL DATA:  Hyponatremia, back pain EXAM: ABDOMEN - 1 VIEW COMPARISON:  None. FINDINGS: Evaluation of the right flank and right upper quadrant is limited by over penetration. Pelvis excluded from view. Nonobstructive abdominal gas pattern. No gross free intraperitoneal gas. Displacement of bowel from the epigastrium may relate to hepatomegaly or gastric distension, but is not well characterized on this exam. Small left pleural effusion noted. Vascular calcifications noted within the abdominal aorta. Osseous structures are age-appropriate. IMPRESSION: Displacement of bowel from the epigastric region possibly related to mass effect related to organomegaly or gastric distension. This may be better assessed with sonography or CT imaging if indicated. Electronically Signed   By: Fidela Salisbury M.D.   On: 03/11/2021 02:08   CT Head Wo Contrast  Result Date:  02/24/2021 CLINICAL DATA:  Neuro deficit, acute, stroke suspected. Fall 2 weeks ago. EXAM: CT HEAD WITHOUT CONTRAST TECHNIQUE: Contiguous axial  images were obtained from the base of the skull through the vertex without intravenous contrast. RADIATION DOSE REDUCTION: This exam was performed according to the departmental dose-optimization program which includes automated exposure control, adjustment of the mA and/or kV according to patient size and/or use of iterative reconstruction technique. COMPARISON:  02/26/2021 FINDINGS: Brain: Bilateral basal ganglia physiologic calcifications. There is atrophy and chronic small vessel disease changes. No acute intracranial abnormality. Specifically, no hemorrhage, hydrocephalus, mass lesion, acute infarction, or significant intracranial injury. Vascular: No hyperdense vessel or unexpected calcification. Skull: No acute calvarial abnormality. Sinuses/Orbits: Air-fluid level in the left maxillary sinus is stable since prior study. Other: None IMPRESSION: Atrophy, chronic microvascular disease. No acute intracranial abnormality. Electronically Signed   By: Rolm Baptise M.D.   On: 02/24/2021 22:48   CT Head Wo Contrast  Result Date: 02/26/2021 CLINICAL DATA:  Head trauma, minor (Age >= 65y); Neck trauma (Age >= 65y), fall EXAM: CT HEAD WITHOUT CONTRAST CT CERVICAL SPINE WITHOUT CONTRAST TECHNIQUE: Multidetector CT imaging of the head and cervical spine was performed following the standard protocol without intravenous contrast. Multiplanar CT image reconstructions of the cervical spine were also generated. RADIATION DOSE REDUCTION: This exam was performed according to the departmental dose-optimization program which includes automated exposure control, adjustment of the mA and/or kV according to patient size and/or use of iterative reconstruction technique. COMPARISON:  MRI head 05/13/2012, CT head 08/24/2015 FINDINGS: CT HEAD FINDINGS BRAIN: BRAIN Cerebral ventricle sizes are concordant with the degree of cerebral volume loss. Patchy and confluent areas of decreased attenuation are noted throughout the deep and  periventricular white matter of the cerebral hemispheres bilaterally, compatible with chronic microvascular ischemic disease. No evidence of large-territorial acute infarction. No parenchymal hemorrhage. No mass lesion. No extra-axial collection. No mass effect or midline shift. No hydrocephalus. Basilar cisterns are patent. Vascular: No hyperdense vessel. Skull: No acute fracture or focal lesion. Sinuses/Orbits: Paranasal sinuses and mastoid air cells are clear. Bilateral lens replacement. Otherwise the orbits are unremarkable. Other: None. CT CERVICAL SPINE FINDINGS Alignment: Normal. Skull base and vertebrae: Multilevel severe degenerative changes of the spine. No acute fracture. No aggressive appearing focal osseous lesion or focal pathologic process. Soft tissues and spinal canal: No prevertebral fluid or swelling. No visible canal hematoma. Upper chest: Biapical pleural/pulmonary scarring. Paraseptal emphysematous changes. Other: Atherosclerotic plaque of the carotid artery within the neck. IMPRESSION: 1.  No acute intracranial abnormality. 2. No acute displaced fracture or traumatic listhesis of the cervical spine. Electronically Signed   By: Iven Finn M.D.   On: 02/26/2021 18:36   CT Cervical Spine Wo Contrast  Result Date: 02/26/2021 CLINICAL DATA:  Head trauma, minor (Age >= 65y); Neck trauma (Age >= 65y), fall EXAM: CT HEAD WITHOUT CONTRAST CT CERVICAL SPINE WITHOUT CONTRAST TECHNIQUE: Multidetector CT imaging of the head and cervical spine was performed following the standard protocol without intravenous contrast. Multiplanar CT image reconstructions of the cervical spine were also generated. RADIATION DOSE REDUCTION: This exam was performed according to the departmental dose-optimization program which includes automated exposure control, adjustment of the mA and/or kV according to patient size and/or use of iterative reconstruction technique. COMPARISON:  MRI head 05/13/2012, CT head  08/24/2015 FINDINGS: CT HEAD FINDINGS BRAIN: BRAIN Cerebral ventricle sizes are concordant with the degree of cerebral volume loss. Patchy and confluent areas of decreased attenuation are noted throughout the deep  and periventricular white matter of the cerebral hemispheres bilaterally, compatible with chronic microvascular ischemic disease. No evidence of large-territorial acute infarction. No parenchymal hemorrhage. No mass lesion. No extra-axial collection. No mass effect or midline shift. No hydrocephalus. Basilar cisterns are patent. Vascular: No hyperdense vessel. Skull: No acute fracture or focal lesion. Sinuses/Orbits: Paranasal sinuses and mastoid air cells are clear. Bilateral lens replacement. Otherwise the orbits are unremarkable. Other: None. CT CERVICAL SPINE FINDINGS Alignment: Normal. Skull base and vertebrae: Multilevel severe degenerative changes of the spine. No acute fracture. No aggressive appearing focal osseous lesion or focal pathologic process. Soft tissues and spinal canal: No prevertebral fluid or swelling. No visible canal hematoma. Upper chest: Biapical pleural/pulmonary scarring. Paraseptal emphysematous changes. Other: Atherosclerotic plaque of the carotid artery within the neck. IMPRESSION: 1.  No acute intracranial abnormality. 2. No acute displaced fracture or traumatic listhesis of the cervical spine. Electronically Signed   By: Iven Finn M.D.   On: 02/26/2021 18:36   CT Lumbar Spine Wo Contrast  Result Date: 03/03/2021 CLINICAL DATA:  Initial evaluation for acute low back pain, increased fracture risk. EXAM: CT LUMBAR SPINE WITHOUT CONTRAST TECHNIQUE: Multidetector CT imaging of the lumbar spine was performed without intravenous contrast administration. Multiplanar CT image reconstructions were also generated. RADIATION DOSE REDUCTION: This exam was performed according to the departmental dose-optimization program which includes automated exposure control, adjustment of  the mA and/or kV according to patient size and/or use of iterative reconstruction technique. COMPARISON:  Prior radiograph from 02/26/2021. FINDINGS: Segmentation: Standard. Lowest well-formed disc space labeled the L5-S1 level. Alignment: Sigmoid scoliotic curvature of the visualized thoracolumbar spine. Trace retrolisthesis of T12 on L1 and L1 on L2, with mild grade 1 anterolisthesis of L4 on L5 and L5 on S1. Findings chronic and facet mediated. Vertebrae: Mild height loss with age-indeterminate lucency seen extending through the partially visualized inferior endplate of 624THL (series 8, image 28). Finding is incompletely assessed on this exam. Otherwise, vertebral body height maintained with no other acute or chronic fracture. Visualized sacrum and pelvis intact. No discrete or worrisome osseous lesions. Visualized lower ribs intact. Paraspinal and other soft tissues: Paraspinous soft tissues demonstrate no acute finding. Small layering bilateral pleural effusions partially visualized. Advanced aorto bi-iliac atherosclerotic disease. Disc levels: T12-L1: Degenerative intervertebral disc space narrowing with disc desiccation and mild disc bulge. Reactive endplate spurring. Mild facet hypertrophy. No stenosis. L1-2: Degenerative intervertebral disc space narrowing with diffuse disc bulge and disc desiccation. Reactive endplate spurring. Mild facet hypertrophy. No spinal stenosis. Foramina remain patent. L2-3: Degenerative intervertebral disc space narrowing with disc desiccation and diffuse disc bulge. Moderate left worse than right facet hypertrophy. Probable mild narrowing of the left lateral recess. Central canal remains patent. No significant foraminal encroachment. L3-4: Diffuse disc bulge with disc desiccation. Superimposed broad-based left foraminal to extraforaminal disc protrusion (series 4, image 59). Moderate bilateral facet hypertrophy. Mild narrowing of the lateral recesses bilaterally. Central canal  remains patent. Probable mild bilateral L3 foraminal stenosis. L4-5: Trace anterolisthesis with degenerative intervertebral disc space narrowing. Mild diffuse disc bulge. Severe right with moderate left facet arthrosis. No significant spinal stenosis. Foramina remain patent. L5-S1: Trace anterolisthesis. No significant disc bulge. Moderate bilateral facet hypertrophy. No stenosis. IMPRESSION: 1. Mild height loss with age-indeterminate lucency extending through the partially visualized inferior endplate of 624THL. Correlation with physical exam for possible pain at this location recommended. Additionally, further assessment with dedicated CT of the thoracic spine could be performed for further evaluation as warranted. 2. No other  acute traumatic injury within the lumbar spine. 3. Sigmoid scoliotic curvature with multilevel degenerative spondylolysis and facet arthrosis as above. No high-grade spinal stenosis. 4. Small layering bilateral pleural effusions, partially visualized. 5. Aortic Atherosclerosis (ICD10-I70.0). Electronically Signed   By: Jeannine Boga M.D.   On: 02/28/2021 22:52   MR BRAIN WO CONTRAST  Result Date: 03/11/2021 CLINICAL DATA:  Neuro deficit, acute, stroke suspected EXAM: MRI HEAD WITHOUT CONTRAST TECHNIQUE: Multiplanar, multiecho pulse sequences of the brain and surrounding structures were obtained without intravenous contrast. COMPARISON:  2014 FINDINGS: Brain: There is no acute infarction or intracranial hemorrhage. There is no intracranial mass, mass effect, or edema. There is no hydrocephalus or extra-axial fluid collection. Ventricles and sulci are enlarged reflecting parenchymal volume loss patchy and confluent areas of T2 hyperintensity in the supratentorial white matter are nonspecific but may reflect chronic microvascular ischemic changes. There are chronic small vessel infarcts of the right corona radiata and left caudate. Small bilateral chronic cerebellar infarcts are again  identified. Vascular: Major vessel flow voids at the skull base are preserved. Skull and upper cervical spine: Normal marrow signal is preserved. Sinuses/Orbits: Paranasal sinuses are aerated. Orbits are unremarkable. Other: Sella is unremarkable.  Mastoid air cells are clear. IMPRESSION: No acute infarction, hemorrhage, or mass. Parenchymal volume loss and chronic microvascular ischemic changes, which have progressed since 2014. Electronically Signed   By: Macy Mis M.D.   On: 03/11/2021 13:38   CT ABDOMEN PELVIS W CONTRAST  Result Date: 03/11/2021 CLINICAL DATA:  Abdominal pain, acute, nonlocalized. EXAM: CT ABDOMEN AND PELVIS WITH CONTRAST TECHNIQUE: Multidetector CT imaging of the abdomen and pelvis was performed using the standard protocol following bolus administration of intravenous contrast. RADIATION DOSE REDUCTION: This exam was performed according to the departmental dose-optimization program which includes automated exposure control, adjustment of the mA and/or kV according to patient size and/or use of iterative reconstruction technique. CONTRAST:  95mL OMNIPAQUE IOHEXOL 300 MG/ML  SOLN COMPARISON:  Radiography same day FINDINGS: Lower chest: Mild scarring at the lung bases. Bilateral pleural effusions layering dependently. Mild cardiomegaly. Hepatobiliary: Liver parenchyma is normal. Few small stones dependent within the gallbladder. No visible ductal stone. Mild ductal prominence, often seen at this age. The gallstones are numerous and small in could pass into the ductal system. If there is clinical concern regarding a ductal stone, MRCP would be suggested. Pancreas: Normal Spleen: Normal Adrenals/Urinary Tract: Adrenal glands are normal. Kidneys are normal. Bladder is full but normal. Stomach/Bowel: Stomach, small intestine and colon all appear normal. Vascular/Lymphatic: Aortic atherosclerosis. No aneurysm. IVC is normal. No adenopathy. Reproductive: Previous hysterectomy.  No pelvic mass.  Other: Previous anterior abdominal wall sutures. No evidence of hernia. There is a T11 compression fracture with loss of height of 50%. Mild posterior bowing of the posterior margin of the vertebral body. This may be a recent fracture. IMPRESSION: Numerous small stones dependent in the gallbladder. Mild prominence of the gallbladder and biliary ductal system, without identifiable ductal stone. If there is concern regarding acute biliary disease, consider ultrasound or nuclear medicine hepatobiliary scan if the concern is that of cholecystitis or consider MRCP if the concern is that of choledocholithiasis. Bilateral pleural effusions layering dependently with mild dependent atelectasis. Aortic Atherosclerosis (ICD10-I70.0). T11 compression fracture with loss of height of 50%. The exact age is indeterminate but I suspect this is a recent fracture Electronically Signed   By: Nelson Chimes M.D.   On: 03/11/2021 10:47   DG CHEST PORT 1 VIEW  Result Date:  03/11/2021 CLINICAL DATA:  Hyponatremia. EXAM: PORTABLE CHEST 1 VIEW COMPARISON:  Chest radiograph dated 05/13/2012 FINDINGS: Small left pleural effusion and left lung base atelectasis. No pneumothorax. Stable cardiac silhouette. Atherosclerotic calcification of the aorta. No acute osseous pathology. IMPRESSION: Small left pleural effusion and left lung base atelectasis. Electronically Signed   By: Anner Crete M.D.   On: 03/11/2021 02:05    Microbiology: Recent Results (from the past 240 hour(s))  Resp Panel by RT-PCR (Flu A&B, Covid) Nasopharyngeal Swab     Status: None   Collection Time: 03/11/21  1:00 AM   Specimen: Nasopharyngeal Swab; Nasopharyngeal(NP) swabs in vial transport medium  Result Value Ref Range Status   SARS Coronavirus 2 by RT PCR NEGATIVE NEGATIVE Final    Comment: (NOTE) SARS-CoV-2 target nucleic acids are NOT DETECTED.  The SARS-CoV-2 RNA is generally detectable in upper respiratory specimens during the acute phase of infection.  The lowest concentration of SARS-CoV-2 viral copies this assay can detect is 138 copies/mL. A negative result does not preclude SARS-Cov-2 infection and should not be used as the sole basis for treatment or other patient management decisions. A negative result may occur with  improper specimen collection/handling, submission of specimen other than nasopharyngeal swab, presence of viral mutation(s) within the areas targeted by this assay, and inadequate number of viral copies(<138 copies/mL). A negative result must be combined with clinical observations, patient history, and epidemiological information. The expected result is Negative.  Fact Sheet for Patients:  EntrepreneurPulse.com.au  Fact Sheet for Healthcare Providers:  IncredibleEmployment.be  This test is no t yet approved or cleared by the [place] FDA and  has been authorized for detection and/or diagnosis of SARS-CoV-2 by FDA under an Emergency Use Authorization (EUA). This EUA will remain  in effect (meaning this test can be used) for the duration of the COVID-19 declaration under Section 564(b)(1) of the Act, 21 U.S.C.section 360bbb-3(b)(1), unless the authorization is terminated  or revoked sooner.       Influenza A by PCR NEGATIVE NEGATIVE Final   Influenza B by PCR NEGATIVE NEGATIVE Final    Comment: (NOTE) The Xpert Xpress SARS-CoV-2/FLU/RSV plus assay is intended as an aid in the diagnosis of influenza from Nasopharyngeal swab specimens and should not be used as a sole basis for treatment. Nasal washings and aspirates are unacceptable for Xpert Xpress SARS-CoV-2/FLU/RSV testing.  Fact Sheet for Patients: EntrepreneurPulse.com.au  Fact Sheet for Healthcare Providers: IncredibleEmployment.be  This test is not yet approved or cleared by the [place] FDA and has been authorized for detection and/or diagnosis of SARS-CoV-2 by FDA  under an Emergency Use Authorization (EUA). This EUA will remain in effect (meaning this test can be used) for the duration of the COVID-19 declaration under Section 564(b)(1) of the Act, 21 U.S.C. section 360bbb-3(b)(1), unless the authorization is terminated or revoked.  Performed at [facility], [place] [address], [place], [place] [address]      Labs: Basic Metabolic Panel: Recent Labs  Lab 03/11/21 0521 03/12/21 0532 03/13/21 0508 03/13/21 2259 03/14/21 0345 03/15/21 0412 04-09-21 0408  NA 122* 126* 129* 128* 130* 125* 130*  K 3.4* 3.4* 4.3 5.2* 5.4* 5.1 4.1  CL 85* 92* 98 98 98 97* 105  CO2 29 25 24 22 23  21* 18*  GLUCOSE 77 89 83 120* 111* 80 78  BUN 16 10 16 20 23  32* 32*  CREATININE 0.89 0.73 0.84 0.89 0.92 0.91 0.71  CALCIUM 8.0* 8.0* 7.7* 8.1* 8.2* 7.7* 6.9*  MG 1.9 1.8  --   --   --   --   --   PHOS 3.1  --   --   --   --   --   --     Liver Function Tests: Recent Labs  Lab 03/13/2021 1928 03/11/21 0521 03/13/21 2259  AST 21 21 30   ALT 17 16 20   ALKPHOS 107 98 134*  BILITOT 1.0 1.2 1.1  PROT 6.3* 5.6* 6.2*  ALBUMIN 3.5 3.1* 3.1*    No results for input(s): LIPASE, AMYLASE in the last 168 hours. No results for input(s): AMMONIA in the last 168 hours. CBC: Recent Labs  Lab 02/19/2021 1928 03/11/21 0521 03/12/21 0532 03/13/21 0508  WBC 8.8 7.4 11.9* 8.8  NEUTROABS  --  5.0  --   --   HGB 11.5* 10.7* 11.8* 10.7*  HCT 34.1* 32.4* 34.4* 32.4*  MCV 93.4 94.5 93.0 96.4  PLT 284 204 263 219    Cardiac Enzymes: Recent Labs  Lab 03/11/21 0521  CKTOTAL 31*    D-Dimer No results for input(s): DDIMER in the last 72 hours. BNP: Invalid input(s): POCBNP CBG: Recent Labs  Lab 03/13/21 2128  GLUCAP 129*    Anemia work up No results for input(s): VITAMINB12, FOLATE, FERRITIN, TIBC, IRON, RETICCTPCT in the last 72 hours. Urinalysis    Component Value Date/Time   COLORURINE YELLOW 02/27/2021 1954   APPEARANCEUR CLEAR  03/17/2021 1954   LABSPEC 1.010 02/18/2021 1954   PHURINE 7.0 03/11/2021 1954   GLUCOSEU NEGATIVE 03/09/2021 [place] NEGATIVE 03/19/2021 1954   BILIRUBINUR NEGATIVE 03/12/2021 1954   KETONESUR 20 (A) 02/28/2021 1954   PROTEINUR NEGATIVE 03/09/2021 1954   UROBILINOGEN 0.2 05/13/2012 1521   NITRITE NEGATIVE 02/24/2021 1954   LEUKOCYTESUR NEGATIVE 03/03/2021 1954   Sepsis Labs Invalid input(s): PROCALCITONIN,  WBC,  LACTICIDVEN     SIGNED:  Charlynne Cousins, MD  [facility] 03/17/2021, 9:58 AM Pager   If 7PM-7AM, please contact night-coverage [email or web address] Password [email or web address]

## 2021-03-20 NOTE — Death Summary Note (Signed)
Death Summary  Candace Perkins [id number] DOB: 27-Apr-1920 DOA: Mar 15, 2021  PCP: Cari Caraway, MD  Admit date: 03/15/21 Date of Death: Mar 22, 2021 Time of Death: 03/21/21 around 1 PM. Notification: Cari Caraway, MD notified of death of 03-22-2021    History of present illness:  Candace Perkins is a 88 y.o. female 88 y.o. female past medical history of hypothyroidism, chronic hyponatremia CVA who comes in from the ED with complaints of back pain and bilateral hand numbness she had a fall 2-week prior to admission and evaluation in the ED was found to have a T11 compression fracture she was also found to have a sodium of 122.  Sodium has been improving nicely, twelve-lead EKG done on 03/12/2021 showed atrial fibrillation her beta-blocker was resumed and her heart rate has improved.  On the day of that she was eating and started to throw up, she was placed on oxygen and later on when the nurse came in she became agonal her breathing stopped and she was pronounced.   Final Diagnoses:  1.   Hypovolemic hyponatremia superimposed on chronic with a baseline of 1 30-1 33: Is improved with fluid resuscitation.  Mild hyperkalemia/hypokalemia: Treated.  New onset atrial fibrillation: She was not a candidate for anticoagulation due to advanced age and risk of fall. She was started on metoprolol and she remained rate controlled.  T11 compression fracture: Exact time it remains unclear patient is not a candidate for intervention she was started on calcitonin nasally.  Chronic kidney stage IIIa: His creatinine is at baseline.  Hyperlipidemia: Hypothyroidism History of CVA   The results of significant diagnostics from this hospitalization (including imaging, microbiology, ancillary and laboratory) are listed below for reference.    Significant Diagnostic Studies: DG Thoracic Spine 2 View  Result Date: 2021-03-15 CLINICAL DATA:  Back pain EXAM: THORACIC SPINE 2 VIEWS COMPARISON:  CT  03/15/21 FINDINGS: Scoliosis. Moderate compression fracture, appears to be at the level of T12 when counting from above on AP view. This would suggest transitional anatomy of the lumbar spine. IMPRESSION: Moderate age indeterminate compression fracture what appears to be T12 level. Suggest correlation with thoracic spine CT Electronically Signed   By: Donavan Foil M.D.   On: 15-Mar-2021 23:36   DG Lumbar Spine Complete  Result Date: 02/26/2021 CLINICAL DATA:  Fall, back pain EXAM: LUMBAR SPINE - COMPLETE 4+ VIEW COMPARISON:  None. FINDINGS: Scoliosis and diffuse degenerative disc and facet disease throughout the lumbar spine. No fracture or subluxation. SI joints symmetric and unremarkable. Diffuse aortoiliac atherosclerosis. No aneurysm. IMPRESSION: Scoliosis and degenerative changes.  No acute bony abnormality. Electronically Signed   By: Rolm Baptise M.D.   On: 02/26/2021 21:29   DG Abd 1 View  Result Date: 03/11/2021 CLINICAL DATA:  Hyponatremia, back pain EXAM: ABDOMEN - 1 VIEW COMPARISON:  None. FINDINGS: Evaluation of the right flank and right upper quadrant is limited by over penetration. Pelvis excluded from view. Nonobstructive abdominal gas pattern. No gross free intraperitoneal gas. Displacement of bowel from the epigastrium may relate to hepatomegaly or gastric distension, but is not well characterized on this exam. Small left pleural effusion noted. Vascular calcifications noted within the abdominal aorta. Osseous structures are age-appropriate. IMPRESSION: Displacement of bowel from the epigastric region possibly related to mass effect related to organomegaly or gastric distension. This may be better assessed with sonography or CT imaging if indicated. Electronically Signed   By: Fidela Salisbury M.D.   On: 03/11/2021 02:08   CT Head Wo Contrast  Result Date: 03/17/2021 CLINICAL DATA:  Neuro deficit, acute, stroke suspected. Fall 2 weeks ago. EXAM: CT HEAD WITHOUT CONTRAST TECHNIQUE:  Contiguous axial images were obtained from the base of the skull through the vertex without intravenous contrast. RADIATION DOSE REDUCTION: This exam was performed according to the departmental dose-optimization program which includes automated exposure control, adjustment of the mA and/or kV according to patient size and/or use of iterative reconstruction technique. COMPARISON:  02/26/2021 FINDINGS: Brain: Bilateral basal ganglia physiologic calcifications. There is atrophy and chronic small vessel disease changes. No acute intracranial abnormality. Specifically, no hemorrhage, hydrocephalus, mass lesion, acute infarction, or significant intracranial injury. Vascular: No hyperdense vessel or unexpected calcification. Skull: No acute calvarial abnormality. Sinuses/Orbits: Air-fluid level in the left maxillary sinus is stable since prior study. Other: None IMPRESSION: Atrophy, chronic microvascular disease. No acute intracranial abnormality. Electronically Signed   By: Rolm Baptise M.D.   On: 02/18/2021 22:48   CT Head Wo Contrast  Result Date: 02/26/2021 CLINICAL DATA:  Head trauma, minor (Age >= 65y); Neck trauma (Age >= 65y), fall EXAM: CT HEAD WITHOUT CONTRAST CT CERVICAL SPINE WITHOUT CONTRAST TECHNIQUE: Multidetector CT imaging of the head and cervical spine was performed following the standard protocol without intravenous contrast. Multiplanar CT image reconstructions of the cervical spine were also generated. RADIATION DOSE REDUCTION: This exam was performed according to the departmental dose-optimization program which includes automated exposure control, adjustment of the mA and/or kV according to patient size and/or use of iterative reconstruction technique. COMPARISON:  MRI head 05/13/2012, CT head 08/24/2015 FINDINGS: CT HEAD FINDINGS BRAIN: BRAIN Cerebral ventricle sizes are concordant with the degree of cerebral volume loss. Patchy and confluent areas of decreased attenuation are noted throughout  the deep and periventricular white matter of the cerebral hemispheres bilaterally, compatible with chronic microvascular ischemic disease. No evidence of large-territorial acute infarction. No parenchymal hemorrhage. No mass lesion. No extra-axial collection. No mass effect or midline shift. No hydrocephalus. Basilar cisterns are patent. Vascular: No hyperdense vessel. Skull: No acute fracture or focal lesion. Sinuses/Orbits: Paranasal sinuses and mastoid air cells are clear. Bilateral lens replacement. Otherwise the orbits are unremarkable. Other: None. CT CERVICAL SPINE FINDINGS Alignment: Normal. Skull base and vertebrae: Multilevel severe degenerative changes of the spine. No acute fracture. No aggressive appearing focal osseous lesion or focal pathologic process. Soft tissues and spinal canal: No prevertebral fluid or swelling. No visible canal hematoma. Upper chest: Biapical pleural/pulmonary scarring. Paraseptal emphysematous changes. Other: Atherosclerotic plaque of the carotid artery within the neck. IMPRESSION: 1.  No acute intracranial abnormality. 2. No acute displaced fracture or traumatic listhesis of the cervical spine. Electronically Signed   By: Iven Finn M.D.   On: 02/26/2021 18:36   CT Cervical Spine Wo Contrast  Result Date: 02/26/2021 CLINICAL DATA:  Head trauma, minor (Age >= 65y); Neck trauma (Age >= 65y), fall EXAM: CT HEAD WITHOUT CONTRAST CT CERVICAL SPINE WITHOUT CONTRAST TECHNIQUE: Multidetector CT imaging of the head and cervical spine was performed following the standard protocol without intravenous contrast. Multiplanar CT image reconstructions of the cervical spine were also generated. RADIATION DOSE REDUCTION: This exam was performed according to the departmental dose-optimization program which includes automated exposure control, adjustment of the mA and/or kV according to patient size and/or use of iterative reconstruction technique. COMPARISON:  MRI head 05/13/2012, CT  head 08/24/2015 FINDINGS: CT HEAD FINDINGS BRAIN: BRAIN Cerebral ventricle sizes are concordant with the degree of cerebral volume loss. Patchy and confluent areas of decreased attenuation are noted throughout  the deep and periventricular white matter of the cerebral hemispheres bilaterally, compatible with chronic microvascular ischemic disease. No evidence of large-territorial acute infarction. No parenchymal hemorrhage. No mass lesion. No extra-axial collection. No mass effect or midline shift. No hydrocephalus. Basilar cisterns are patent. Vascular: No hyperdense vessel. Skull: No acute fracture or focal lesion. Sinuses/Orbits: Paranasal sinuses and mastoid air cells are clear. Bilateral lens replacement. Otherwise the orbits are unremarkable. Other: None. CT CERVICAL SPINE FINDINGS Alignment: Normal. Skull base and vertebrae: Multilevel severe degenerative changes of the spine. No acute fracture. No aggressive appearing focal osseous lesion or focal pathologic process. Soft tissues and spinal canal: No prevertebral fluid or swelling. No visible canal hematoma. Upper chest: Biapical pleural/pulmonary scarring. Paraseptal emphysematous changes. Other: Atherosclerotic plaque of the carotid artery within the neck. IMPRESSION: 1.  No acute intracranial abnormality. 2. No acute displaced fracture or traumatic listhesis of the cervical spine. Electronically Signed   By: Iven Finn M.D.   On: 02/26/2021 18:36   CT Lumbar Spine Wo Contrast  Result Date: 02/21/2021 CLINICAL DATA:  Initial evaluation for acute low back pain, increased fracture risk. EXAM: CT LUMBAR SPINE WITHOUT CONTRAST TECHNIQUE: Multidetector CT imaging of the lumbar spine was performed without intravenous contrast administration. Multiplanar CT image reconstructions were also generated. RADIATION DOSE REDUCTION: This exam was performed according to the departmental dose-optimization program which includes automated exposure control,  adjustment of the mA and/or kV according to patient size and/or use of iterative reconstruction technique. COMPARISON:  Prior radiograph from 02/26/2021. FINDINGS: Segmentation: Standard. Lowest well-formed disc space labeled the L5-S1 level. Alignment: Sigmoid scoliotic curvature of the visualized thoracolumbar spine. Trace retrolisthesis of T12 on L1 and L1 on L2, with mild grade 1 anterolisthesis of L4 on L5 and L5 on S1. Findings chronic and facet mediated. Vertebrae: Mild height loss with age-indeterminate lucency seen extending through the partially visualized inferior endplate of [id number] (series 8, image 28). Finding is incompletely assessed on this exam. Otherwise, vertebral body height maintained with no other acute or chronic fracture. Visualized sacrum and pelvis intact. No discrete or worrisome osseous lesions. Visualized lower ribs intact. Paraspinal and other soft tissues: Paraspinous soft tissues demonstrate no acute finding. Small layering bilateral pleural effusions partially visualized. Advanced aorto bi-iliac atherosclerotic disease. Disc levels: T12-L1: Degenerative intervertebral disc space narrowing with disc desiccation and mild disc bulge. Reactive endplate spurring. Mild facet hypertrophy. No stenosis. L1-2: Degenerative intervertebral disc space narrowing with diffuse disc bulge and disc desiccation. Reactive endplate spurring. Mild facet hypertrophy. No spinal stenosis. Foramina remain patent. L2-3: Degenerative intervertebral disc space narrowing with disc desiccation and diffuse disc bulge. Moderate left worse than right facet hypertrophy. Probable mild narrowing of the left lateral recess. Central canal remains patent. No significant foraminal encroachment. L3-4: Diffuse disc bulge with disc desiccation. Superimposed broad-based left foraminal to extraforaminal disc protrusion (series 4, image 59). Moderate bilateral facet hypertrophy. Mild narrowing of the lateral recesses bilaterally.  Central canal remains patent. Probable mild bilateral L3 foraminal stenosis. L4-5: Trace anterolisthesis with degenerative intervertebral disc space narrowing. Mild diffuse disc bulge. Severe right with moderate left facet arthrosis. No significant spinal stenosis. Foramina remain patent. L5-S1: Trace anterolisthesis. No significant disc bulge. Moderate bilateral facet hypertrophy. No stenosis. IMPRESSION: 1. Mild height loss with age-indeterminate lucency extending through the partially visualized inferior endplate of 624THL. Correlation with physical exam for possible pain at this location recommended. Additionally, further assessment with dedicated CT of the thoracic spine could be performed for further evaluation as warranted. 2.  No other acute traumatic injury within the lumbar spine. 3. Sigmoid scoliotic curvature with multilevel degenerative spondylolysis and facet arthrosis as above. No high-grade spinal stenosis. 4. Small layering bilateral pleural effusions, partially visualized. 5. Aortic Atherosclerosis (ICD10-I70.0). Electronically Signed   By: Jeannine Boga M.D.   On: 02/28/2021 22:52   MR BRAIN WO CONTRAST  Result Date: 03/11/2021 CLINICAL DATA:  Neuro deficit, acute, stroke suspected EXAM: MRI HEAD WITHOUT CONTRAST TECHNIQUE: Multiplanar, multiecho pulse sequences of the brain and surrounding structures were obtained without intravenous contrast. COMPARISON:  2014 FINDINGS: Brain: There is no acute infarction or intracranial hemorrhage. There is no intracranial mass, mass effect, or edema. There is no hydrocephalus or extra-axial fluid collection. Ventricles and sulci are enlarged reflecting parenchymal volume loss patchy and confluent areas of T2 hyperintensity in the supratentorial white matter are nonspecific but may reflect chronic microvascular ischemic changes. There are chronic small vessel infarcts of the right corona radiata and left caudate. Small bilateral chronic cerebellar  infarcts are again identified. Vascular: Major vessel flow voids at the skull base are preserved. Skull and upper cervical spine: Normal marrow signal is preserved. Sinuses/Orbits: Paranasal sinuses are aerated. Orbits are unremarkable. Other: Sella is unremarkable.  Mastoid air cells are clear. IMPRESSION: No acute infarction, hemorrhage, or mass. Parenchymal volume loss and chronic microvascular ischemic changes, which have progressed since 2014. Electronically Signed   By: Macy Mis M.D.   On: 03/11/2021 13:38   CT ABDOMEN PELVIS W CONTRAST  Result Date: 03/11/2021 CLINICAL DATA:  Abdominal pain, acute, nonlocalized. EXAM: CT ABDOMEN AND PELVIS WITH CONTRAST TECHNIQUE: Multidetector CT imaging of the abdomen and pelvis was performed using the standard protocol following bolus administration of intravenous contrast. RADIATION DOSE REDUCTION: This exam was performed according to the departmental dose-optimization program which includes automated exposure control, adjustment of the mA and/or kV according to patient size and/or use of iterative reconstruction technique. CONTRAST:  39mL OMNIPAQUE IOHEXOL 300 MG/ML  SOLN COMPARISON:  Radiography same day FINDINGS: Lower chest: Mild scarring at the lung bases. Bilateral pleural effusions layering dependently. Mild cardiomegaly. Hepatobiliary: Liver parenchyma is normal. Few small stones dependent within the gallbladder. No visible ductal stone. Mild ductal prominence, often seen at this age. The gallstones are numerous and small in could pass into the ductal system. If there is clinical concern regarding a ductal stone, MRCP would be suggested. Pancreas: Normal Spleen: Normal Adrenals/Urinary Tract: Adrenal glands are normal. Kidneys are normal. Bladder is full but normal. Stomach/Bowel: Stomach, small intestine and colon all appear normal. Vascular/Lymphatic: Aortic atherosclerosis. No aneurysm. IVC is normal. No adenopathy. Reproductive: Previous  hysterectomy.  No pelvic mass. Other: Previous anterior abdominal wall sutures. No evidence of hernia. There is a T11 compression fracture with loss of height of 50%. Mild posterior bowing of the posterior margin of the vertebral body. This may be a recent fracture. IMPRESSION: Numerous small stones dependent in the gallbladder. Mild prominence of the gallbladder and biliary ductal system, without identifiable ductal stone. If there is concern regarding acute biliary disease, consider ultrasound or nuclear medicine hepatobiliary scan if the concern is that of cholecystitis or consider MRCP if the concern is that of choledocholithiasis. Bilateral pleural effusions layering dependently with mild dependent atelectasis. Aortic Atherosclerosis (ICD10-I70.0). T11 compression fracture with loss of height of 50%. The exact age is indeterminate but I suspect this is a recent fracture Electronically Signed   By: Nelson Chimes M.D.   On: 03/11/2021 10:47   DG CHEST PORT 1 VIEW  Result Date: 03/11/2021 CLINICAL DATA:  Hyponatremia. EXAM: PORTABLE CHEST 1 VIEW COMPARISON:  Chest radiograph dated 05/13/2012 FINDINGS: Small left pleural effusion and left lung base atelectasis. No pneumothorax. Stable cardiac silhouette. Atherosclerotic calcification of the aorta. No acute osseous pathology. IMPRESSION: Small left pleural effusion and left lung base atelectasis. Electronically Signed   By: Anner Crete M.D.   On: 03/11/2021 02:05    Microbiology: Recent Results (from the past 240 hour(s))  Resp Panel by RT-PCR (Flu A&B, Covid) Nasopharyngeal Swab     Status: None   Collection Time: 03/11/21  1:00 AM   Specimen: Nasopharyngeal Swab; Nasopharyngeal(NP) swabs in vial transport medium  Result Value Ref Range Status   SARS Coronavirus 2 by RT PCR NEGATIVE NEGATIVE Final    Comment: (NOTE) SARS-CoV-2 target nucleic acids are NOT DETECTED.  The SARS-CoV-2 RNA is generally detectable in upper respiratory specimens  during the acute phase of infection. The lowest concentration of SARS-CoV-2 viral copies this assay can detect is 138 copies/mL. A negative result does not preclude SARS-Cov-2 infection and should not be used as the sole basis for treatment or other patient management decisions. A negative result may occur with  improper specimen collection/handling, submission of specimen other than nasopharyngeal swab, presence of viral mutation(s) within the areas targeted by this assay, and inadequate number of viral copies(<138 copies/mL). A negative result must be combined with clinical observations, patient history, and epidemiological information. The expected result is Negative.  Fact Sheet for Patients:  EntrepreneurPulse.com.au  Fact Sheet for Healthcare Providers:  IncredibleEmployment.be  This test is no t yet approved or cleared by the [place] FDA and  has been authorized for detection and/or diagnosis of SARS-CoV-2 by FDA under an Emergency Use Authorization (EUA). This EUA will remain  in effect (meaning this test can be used) for the duration of the COVID-19 declaration under Section 564(b)(1) of the Act, 21 U.S.C.section 360bbb-3(b)(1), unless the authorization is terminated  or revoked sooner.       Influenza A by PCR NEGATIVE NEGATIVE Final   Influenza B by PCR NEGATIVE NEGATIVE Final    Comment: (NOTE) The Xpert Xpress SARS-CoV-2/FLU/RSV plus assay is intended as an aid in the diagnosis of influenza from Nasopharyngeal swab specimens and should not be used as a sole basis for treatment. Nasal washings and aspirates are unacceptable for Xpert Xpress SARS-CoV-2/FLU/RSV testing.  Fact Sheet for Patients: EntrepreneurPulse.com.au  Fact Sheet for Healthcare Providers: IncredibleEmployment.be  This test is not yet approved or cleared by the [place] FDA and has been authorized for detection  and/or diagnosis of SARS-CoV-2 by FDA under an Emergency Use Authorization (EUA). This EUA will remain in effect (meaning this test can be used) for the duration of the COVID-19 declaration under Section 564(b)(1) of the Act, 21 U.S.C. section 360bbb-3(b)(1), unless the authorization is terminated or revoked.  Performed at [facility], [place] [address], [place], [place] [address]      Labs: Basic Metabolic Panel: Recent Labs  Lab 03/11/21 0521 03/12/21 0532 03/13/21 0508 03/13/21 2259 03/14/21 0345 03/15/21 0412 March 31, 2021 0408  NA 122* 126* 129* 128* 130* 125* 130*  K 3.4* 3.4* 4.3 5.2* 5.4* 5.1 4.1  CL 85* 92* 98 98 98 97* 105  CO2 29 25 24 22 23  21* 18*  GLUCOSE 77 89 83 120* 111* 80 78  BUN 16 10 16 20 23  32* 32*  CREATININE 0.89 0.73 0.84 0.89 0.92 0.91 0.71  CALCIUM 8.0* 8.0* 7.7* 8.1* 8.2*  7.7* 6.9*  MG 1.9 1.8  --   --   --   --   --   PHOS 3.1  --   --   --   --   --   --    Liver Function Tests: Recent Labs  Lab 03/11/2021 1928 03/11/21 0521 03/13/21 2259  AST 21 21 30   ALT 17 16 20   ALKPHOS 107 98 134*  BILITOT 1.0 1.2 1.1  PROT 6.3* 5.6* 6.2*  ALBUMIN 3.5 3.1* 3.1*   No results for input(s): LIPASE, AMYLASE in the last 168 hours. No results for input(s): AMMONIA in the last 168 hours. CBC: Recent Labs  Lab 02/25/2021 1928 03/11/21 0521 03/12/21 0532 03/13/21 0508  WBC 8.8 7.4 11.9* 8.8  NEUTROABS  --  5.0  --   --   HGB 11.5* 10.7* 11.8* 10.7*  HCT 34.1* 32.4* 34.4* 32.4*  MCV 93.4 94.5 93.0 96.4  PLT 284 204 263 219   Cardiac Enzymes: Recent Labs  Lab 03/11/21 0521  CKTOTAL 31*   D-Dimer No results for input(s): DDIMER in the last 72 hours. BNP: Invalid input(s): POCBNP CBG: Recent Labs  Lab 03/13/21 2128  GLUCAP 129*   Anemia work up No results for input(s): VITAMINB12, FOLATE, FERRITIN, TIBC, IRON, RETICCTPCT in the last 72 hours. Urinalysis    Component Value Date/Time   COLORURINE YELLOW 02/19/2021 1954    APPEARANCEUR CLEAR 03/13/2021 1954   LABSPEC 1.010 03/09/2021 1954   PHURINE 7.0 02/17/2021 1954   GLUCOSEU NEGATIVE 02/22/2021 1954   HGBUR NEGATIVE 03/07/2021 1954   BILIRUBINUR NEGATIVE 03/18/2021 1954   KETONESUR 20 (A) 03/02/2021 1954   PROTEINUR NEGATIVE 03/14/2021 1954   UROBILINOGEN 0.2 05/13/2012 1521   NITRITE NEGATIVE 03/07/2021 1954   LEUKOCYTESUR NEGATIVE 03/01/2021 1954   Sepsis Labs Invalid input(s): PROCALCITONIN,  WBC,  LACTICIDVEN     SIGNED:  Charlynne Cousins, MD  [facility] 03/17/2021, 9:59 AM Pager   If 7PM-7AM, please contact night-coverage [email or web address] Password [email or web address]

## 2021-03-20 NOTE — Progress Notes (Signed)
[facility] PROGRESS NOTE    Progress Note  Candace Perkins  GHW:[id number] DOB: 06/25/1920 DOA: 03-29-2021 PCP: Gweneth Dimitri, MD     Brief Narrative:   Candace SPONAUGLE is an 88 y.o. female past medical history of hypothyroidism, chronic hyponatremia CVA who comes in from the ED with complaints of back pain and bilateral hand numbness she had a fall 2-week prior to admission and evaluation in the ED was found to have a T11 compression fracture she was also found to have a sodium of 122.  Sodium has been improving nicely, twelve-lead EKG done on 03/12/2021 showed atrial fibrillation her beta-blocker was resumed and her heart rate has improved.   Assessment/Plan:   Hypovolemic hyponatremia, superimposed on chronic with a baseline like around 1 30-133: Sodium is improving with fluid resuscitation. Awaiting skilled nursing facility placement. Patient has not worked with physical therapy in the last 2 weeks  Mild hyperkalemia/hypokalemia:  Now improved after stopping oral supplementation.  New onset A. fib with RVR: Heart rate controlled, AV nodal blocking agent was initially  held due to hypotension.  Now transition to oral metoprolol. Patient not a candidate for anticoagulation due to advanced age.  Possibly left-sided weakness: MRI of the brain showed no acute findings. She had a previous CVA so this left-sided weakness probably chronic.  T11 compression fracture: Exact timing is unclear but images suggestive of acute patient not a candidate for intervention. Continue to use calcitonin nasally  Chronic kidney disease stage IIIa: Creatinine at baseline.  Hyperlipidemia: Continue medication.  Essential hypertension: Continue metoprolol. She will need to go home off diuretic therapy.  As she is having poor fluid intake.  Hypothyroidism: TSH  2.4 resume Synthroid at a lower dose in the setting of new onset A. fib with RVR can be increased as an outpatient..  History  of CVA: Noted.   DVT prophylaxis: lovenox Family Communication:none Status is: Inpatient  Remains inpatient appropriate because: New onset of A. fib with RVR probably back to skilled in 72 to 48 hours.   Code Status:     Code Status Orders  (From admission, onward)           Start     Ordered   03/11/21 0251  Do not attempt resuscitation (DNR)  Continuous       Question Answer Comment  In the event of cardiac or respiratory ARREST Do not call a code blue   In the event of cardiac or respiratory ARREST Do not perform Intubation, CPR, defibrillation or ACLS   In the event of cardiac or respiratory ARREST Use medication by any route, position, wound care, and other measures to relive pain and suffering. May use oxygen, suction and manual treatment of airway obstruction as needed for comfort.      03/11/21 0250           Code Status History     This patient has a current code status but no historical code status.      Advance Directive Documentation    Flowsheet Row Most Recent Value  Type of Advance Directive Out of facility DNR (pink MOST or yellow form)  Pre-existing out of facility DNR order (yellow form or pink MOST form) Yellow form placed in chart (order not valid for inpatient use)  "MOST" Form in Place? --         IV Access:   Peripheral IV   Procedures and diagnostic studies:   No results found.   Medical Consultants:  None.   Subjective:    Candace Perkins no complaints this morning.  Objective:    Vitals:   03/15/21 1551 03/15/21 2040 03/25/2021 0010 March 25, 2021 0457  BP: 118/90 110/81 127/77 107/86  Pulse: (!) 101 (!) 107 (!) 109 (!) 105  Resp: 16 18 20 17   Temp: 98.2 F (36.8 C) 98.1 F (36.7 C) 97.8 F (36.6 C) 97.7 F (36.5 C)  TempSrc:  Axillary Axillary Axillary  SpO2: 95% 95% 100% 98%  Weight:      Height:       SpO2: 98 %   Intake/Output Summary (Last 24 hours) at 03/25/21 0955 Last data filed at 03-25-21  0804 Gross per 24 hour  Intake 1411.68 ml  Output 200 ml  Net 1211.68 ml    Filed Weights   03/11/21 0128  Weight: 47.6 kg    Exam: General exam: In no acute distress. Respiratory system: Good air movement and clear to auscultation. Cardiovascular system: S1 & S2 heard, RRR. No JVD. Gastrointestinal system: Abdomen is nondistended, soft and nontender.  Extremities: No pedal edema. Skin: No rashes, lesions or ulcers Psychiatry: Judgement and insight appear normal. Mood & affect appropriate.  Data Reviewed:    Labs: Basic Metabolic Panel: Recent Labs  Lab 03/11/21 0521 03/12/21 0532 03/13/21 [id number] 03/13/21 2259 03/14/21 0345 03/15/21 0412 03-25-2021 0408  NA 122* 126* 129* 128* 130* 125* 130*  K 3.4* 3.4* 4.3 5.2* 5.4* 5.1 4.1  CL 85* 92* 98 98 98 97* 105  CO2 29 25 24 22 23  21* 18*  GLUCOSE 77 89 83 120* 111* 80 78  BUN 16 10 16 20 23  32* 32*  CREATININE 0.89 0.73 0.84 0.89 0.92 0.91 0.71  CALCIUM 8.0* 8.0* 7.7* 8.1* 8.2* 7.7* 6.9*  MG 1.9 1.8  --   --   --   --   --   PHOS 3.1  --   --   --   --   --   --     GFR Estimated Creatinine Clearance: 28.8 mL/min (by C-G formula based on SCr of 0.71 mg/dL). Liver Function Tests: Recent Labs  Lab 02/23/2021 1928 03/11/21 0521 03/13/21 2259  AST 21 21 30   ALT 17 16 20   ALKPHOS 107 98 134*  BILITOT 1.0 1.2 1.1  PROT 6.3* 5.6* 6.2*  ALBUMIN 3.5 3.1* 3.1*    No results for input(s): LIPASE, AMYLASE in the last 168 hours. No results for input(s): AMMONIA in the last 168 hours. Coagulation profile No results for input(s): INR, PROTIME in the last 168 hours. COVID-19 Labs  No results for input(s): DDIMER, FERRITIN, LDH, CRP in the last 72 hours.  Lab Results  Component Value Date   [place] NEGATIVE 03/11/2021    CBC: Recent Labs  Lab 02/20/2021 1928 03/11/21 0521 03/12/21 0532 03/13/21 0508  WBC 8.8 7.4 11.9* 8.8  NEUTROABS  --  5.0  --   --   HGB 11.5* 10.7* 11.8* 10.7*  HCT 34.1* 32.4* 34.4* 32.4*   MCV 93.4 94.5 93.0 96.4  PLT 284 204 263 219    Cardiac Enzymes: Recent Labs  Lab 03/11/21 0521  CKTOTAL 31*    BNP (last 3 results) No results for input(s): PROBNP in the last 8760 hours. CBG: Recent Labs  Lab 03/13/21 2128  GLUCAP 129*    D-Dimer: No results for input(s): DDIMER in the last 72 hours. Hgb A1c: No results for input(s): HGBA1C in the last 72 hours. Lipid Profile: No results for input(s):  CHOL, HDL, LDLCALC, TRIG, CHOLHDL, LDLDIRECT in the last 72 hours. Thyroid function studies: No results for input(s): TSH, T4TOTAL, T3FREE, THYROIDAB in the last 72 hours.  Invalid input(s): FREET3  Anemia work up: No results for input(s): VITAMINB12, FOLATE, FERRITIN, TIBC, IRON, RETICCTPCT in the last 72 hours. Sepsis Labs: Recent Labs  Lab 03/11/2021 1928 03/11/21 0521 03/12/21 0532 03/13/21 0508  WBC 8.8 7.4 11.9* 8.8    Microbiology Recent Results (from the past 240 hour(s))  Resp Panel by RT-PCR (Flu A&B, Covid) Nasopharyngeal Swab     Status: None   Collection Time: 03/11/21  1:00 AM   Specimen: Nasopharyngeal Swab; Nasopharyngeal(NP) swabs in vial transport medium  Result Value Ref Range Status   SARS Coronavirus 2 by RT PCR NEGATIVE NEGATIVE Final    Comment: (NOTE) SARS-CoV-2 target nucleic acids are NOT DETECTED.  The SARS-CoV-2 RNA is generally detectable in upper respiratory specimens during the acute phase of infection. The lowest concentration of SARS-CoV-2 viral copies this assay can detect is 138 copies/mL. A negative result does not preclude SARS-Cov-2 infection and should not be used as the sole basis for treatment or other patient management decisions. A negative result may occur with  improper specimen collection/handling, submission of specimen other than nasopharyngeal swab, presence of viral mutation(s) within the areas targeted by this assay, and inadequate number of viral copies(<138 copies/mL). A negative result must be combined  with clinical observations, patient history, and epidemiological information. The expected result is Negative.  Fact Sheet for Patients:  EntrepreneurPulse.com.au  Fact Sheet for Healthcare Providers:  IncredibleEmployment.be  This test is no t yet approved or cleared by the [place] FDA and  has been authorized for detection and/or diagnosis of SARS-CoV-2 by FDA under an Emergency Use Authorization (EUA). This EUA will remain  in effect (meaning this test can be used) for the duration of the COVID-19 declaration under Section 564(b)(1) of the Act, 21 U.S.C.section 360bbb-3(b)(1), unless the authorization is terminated  or revoked sooner.       Influenza A by PCR NEGATIVE NEGATIVE Final   Influenza B by PCR NEGATIVE NEGATIVE Final    Comment: (NOTE) The Xpert Xpress SARS-CoV-2/FLU/RSV plus assay is intended as an aid in the diagnosis of influenza from Nasopharyngeal swab specimens and should not be used as a sole basis for treatment. Nasal washings and aspirates are unacceptable for Xpert Xpress SARS-CoV-2/FLU/RSV testing.  Fact Sheet for Patients: EntrepreneurPulse.com.au  Fact Sheet for Healthcare Providers: IncredibleEmployment.be  This test is not yet approved or cleared by the [place] FDA and has been authorized for detection and/or diagnosis of SARS-CoV-2 by FDA under an Emergency Use Authorization (EUA). This EUA will remain in effect (meaning this test can be used) for the duration of the COVID-19 declaration under Section 564(b)(1) of the Act, 21 U.S.C. section 360bbb-3(b)(1), unless the authorization is terminated or revoked.  Performed at [facility], [place] [address] [place], [place] [address]      Medications:    aspirin EC  81 mg Oral Daily   buPROPion  75 mg Oral Daily   calcitonin (salmon)  1 spray Alternating Nares Daily   levothyroxine  12.5 mcg  Oral Q0600   metoprolol tartrate  50 mg Oral BID   pravastatin  40 mg Oral QPM   senna  2 tablet Oral Daily   traZODone  25 mg Oral QHS   Continuous Infusions:      LOS: 5 days   Charlynne Cousins  [facility]  04/01/21, 9:55 AM

## 2021-03-20 DEATH — deceased

## 2022-02-08 IMAGING — MR MR HEAD W/O CM
10 series · 48 of 48 positions shown · non-contrast
Comparison: 7270

CLINICAL DATA: Neuro deficit, acute, stroke suspected

EXAM:
MRI HEAD WITHOUT CONTRAST
TECHNIQUE: Multiplanar, multiecho pulse sequences of the brain and surrounding
structures were obtained without intravenous contrast.

[Series 5: DWI · axial · 3.0mm · 1.36mm/px · z∈[-28,+110]mm · 10 of 96 slices shown (1 of 4)]
[im 1/96]
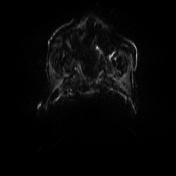
[im 11/96]
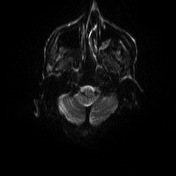
[im 22/96]
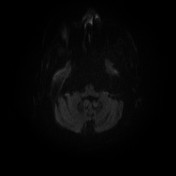
[im 32/96]
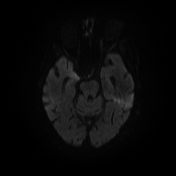
[im 43/96]
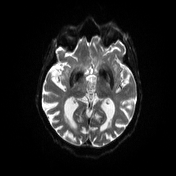
[im 53/96]
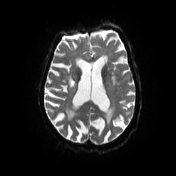
[im 64/96]
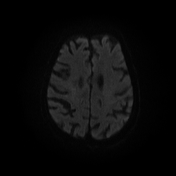
[im 74/96]
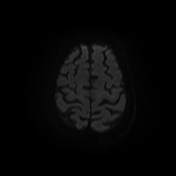
[im 85/96]
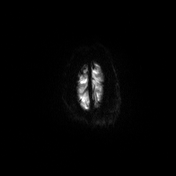
[im 96/96]
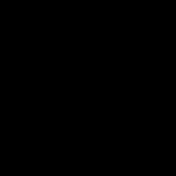

[Series 6: DWI · axial · 3.0mm · 1.36mm/px · z∈[-28,+104]mm · 5 of 46 slices shown (2 of 4)]
[im 1/46]
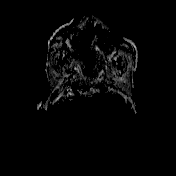
[im 12/46]
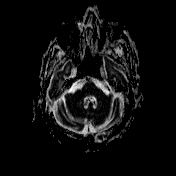
[im 23/46]
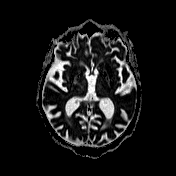
[im 34/46]
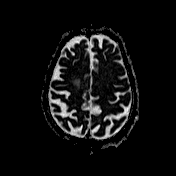
[im 46/46]
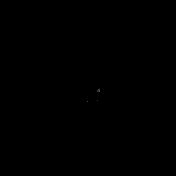

[Series 7: T2 · sagittal · 5.0mm · 0.47mm/px · 3 of 24 slices shown (1 of 3)]
[im 1/24]
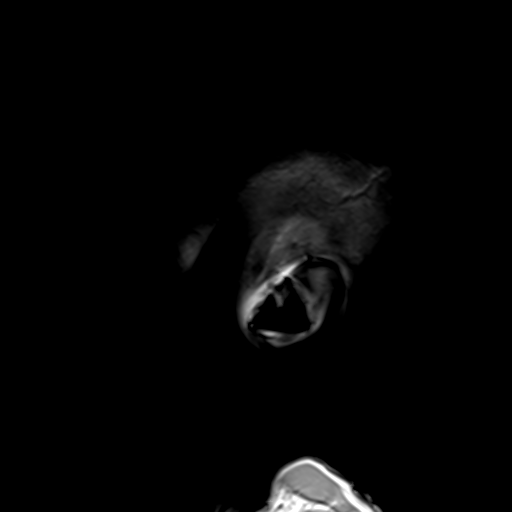
[im 12/24]
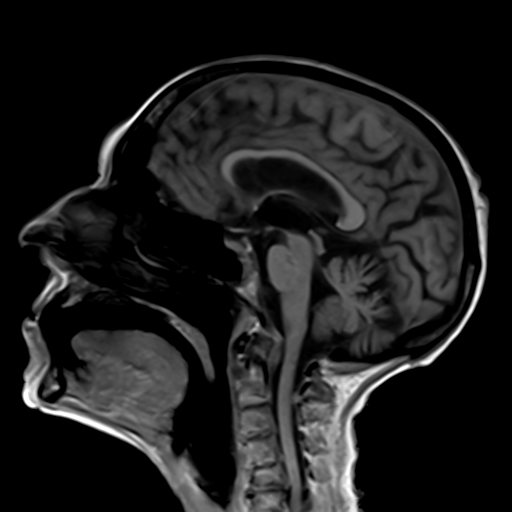
[im 24/24]
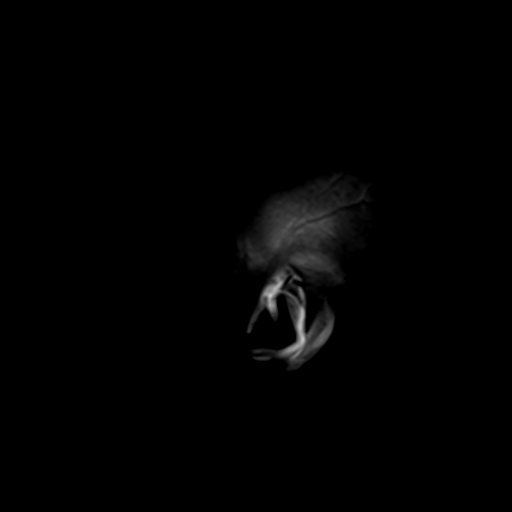

[Series 8: T2 · axial · 5.0mm · 0.45mm/px · z∈[-28,+105]mm · 2 of 22 slices shown (2 of 3)]
[im 1/22]
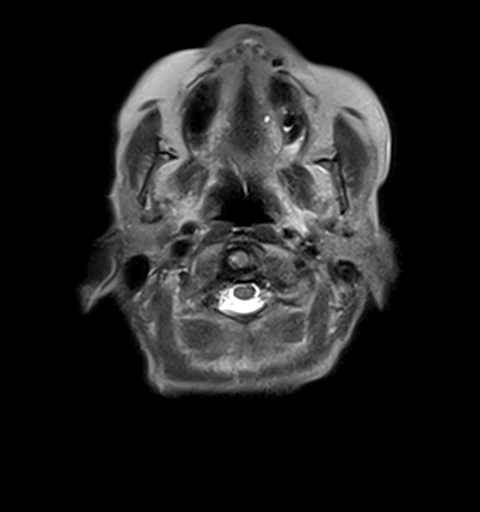
[im 22/22]
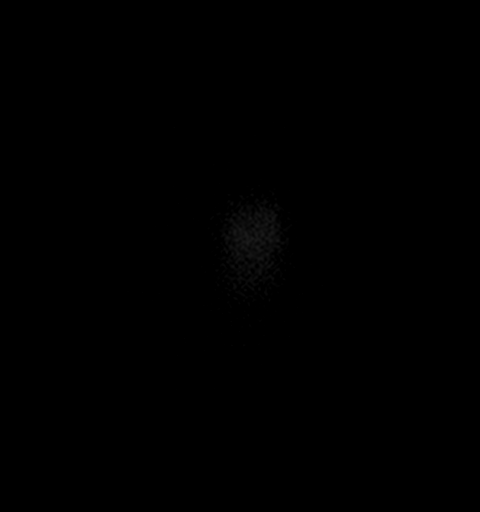

[Series 9: GRE · axial · 3.0mm · 0.45mm/px · z∈[-28,+98]mm · 5 of 44 slices shown]
[im 1/44]
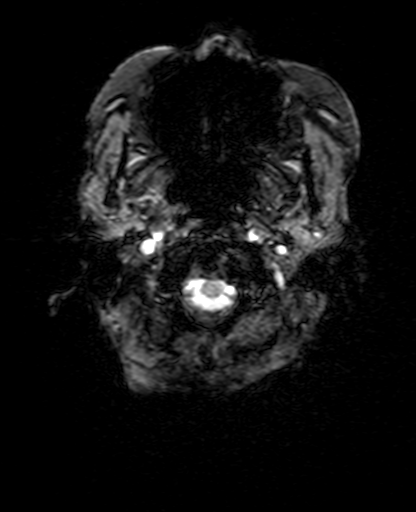
[im 11/44]
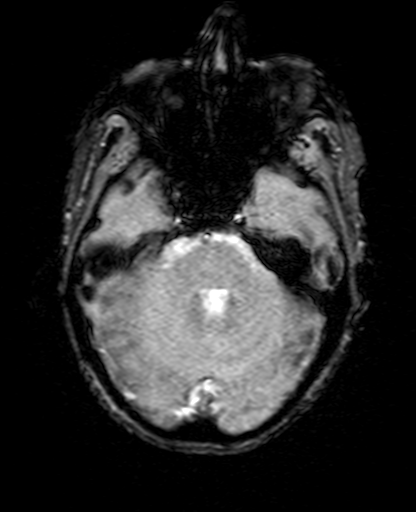
[im 22/44]
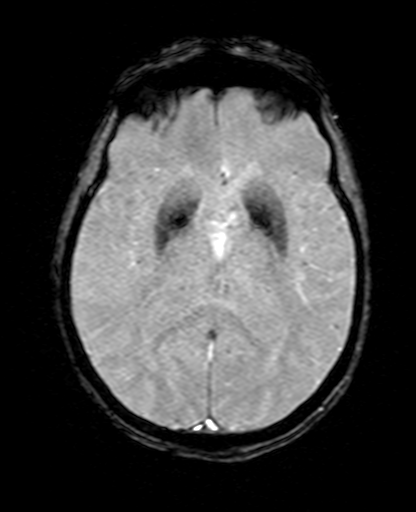
[im 33/44]
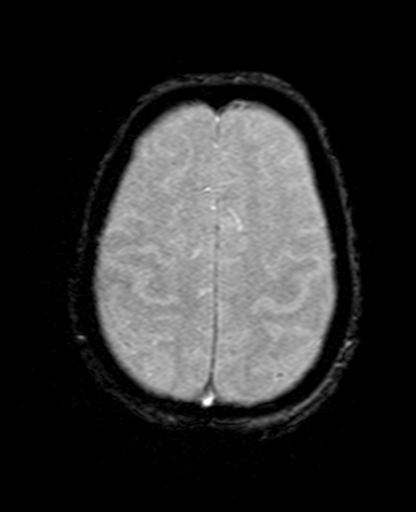
[im 44/44]
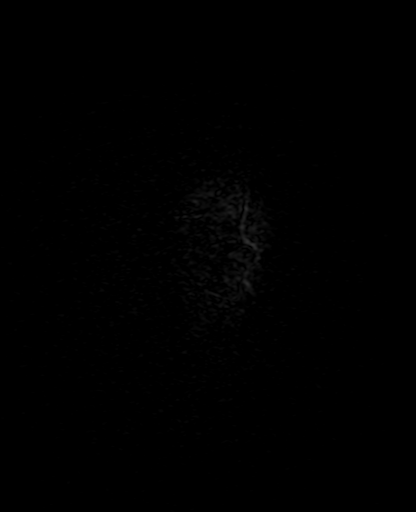

[Series 10: FLAIR · axial · 3.0mm · 0.86mm/px · z∈[-39,+108]mm · 6 of 51 slices shown]
[im 1/51]
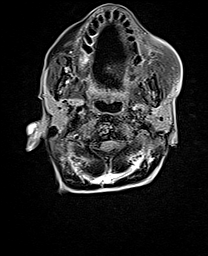
[im 11/51]
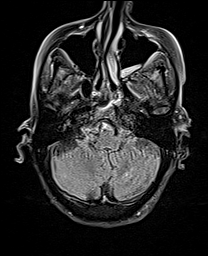
[im 21/51]
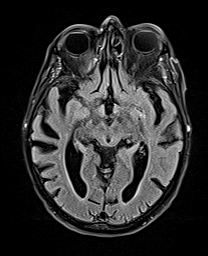
[im 31/51]
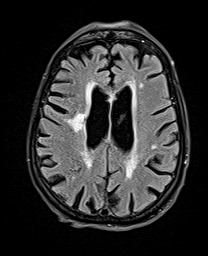
[im 41/51]
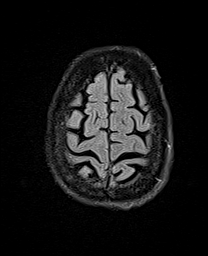
[im 51/51]
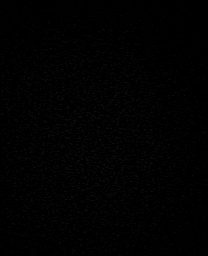

[Series 11: T1 · axial · 3.0mm · 0.45mm/px · z∈[-28,+98]mm · 5 of 44 slices shown]
[im 1/44]
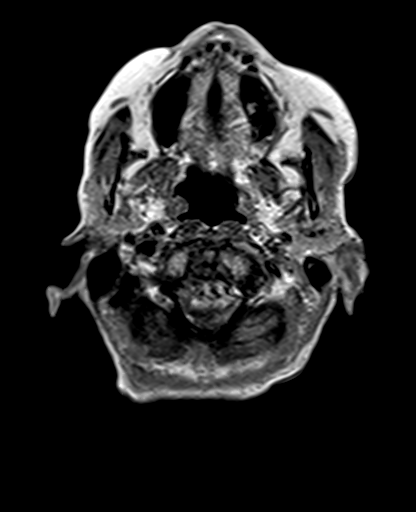
[im 11/44]
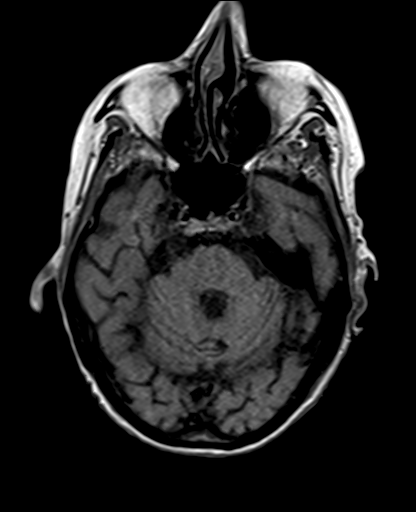
[im 22/44]
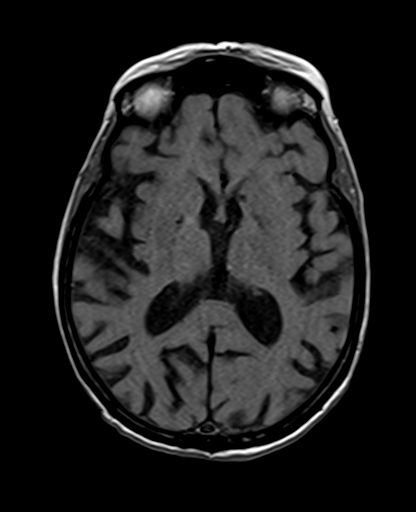
[im 33/44]
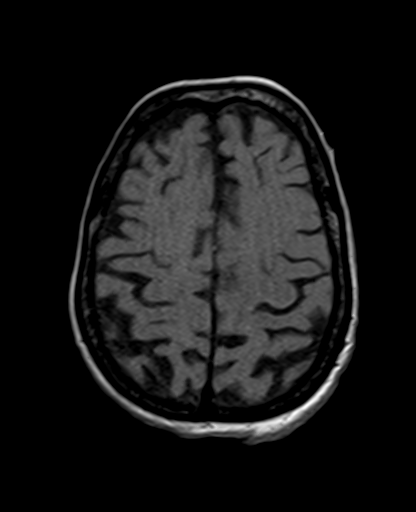
[im 44/44]
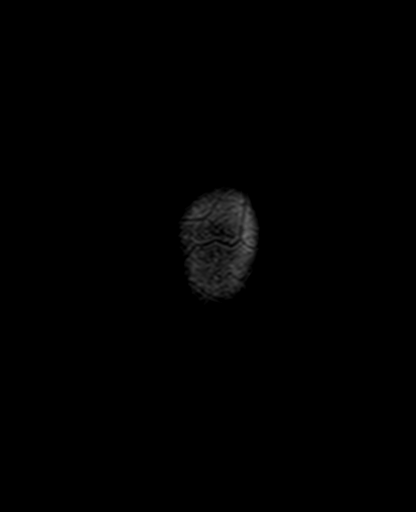

[Series 12: DWI · coronal · 5.0mm · 1.31mm/px · 6 of 56 slices shown (3 of 4)]
[im 1/56]
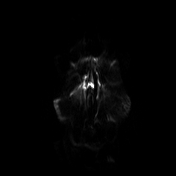
[im 12/56]
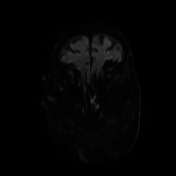
[im 23/56]
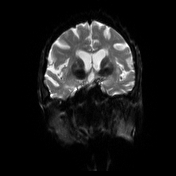
[im 34/56]
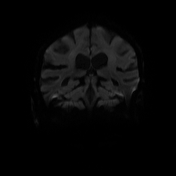
[im 45/56]
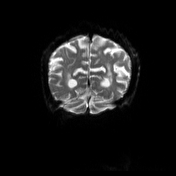
[im 56/56]
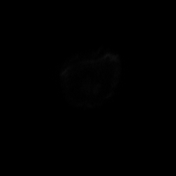

[Series 13: DWI · coronal · 5.0mm · 1.31mm/px · 3 of 28 slices shown (4 of 4)]
[im 1/28]
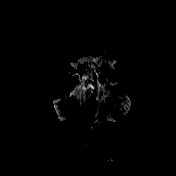
[im 14/28]
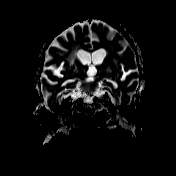
[im 28/28]
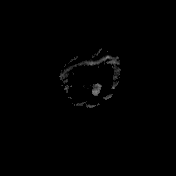

[Series 14: T2 · coronal · 5.0mm · 0.86mm/px · 3 of 28 slices shown (3 of 3)]
[im 1/28]
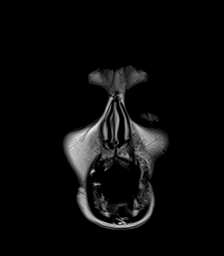
[im 14/28]
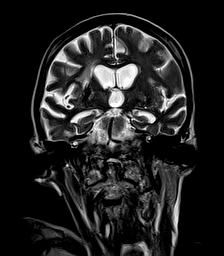
[im 28/28]
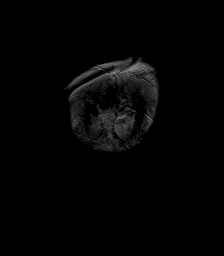

[48 of 48 positions shown; findings below may reference images not displayed]

FINDINGS: Brain: There is no acute infarction or intracranial hemorrhage.
There is no intracranial mass, mass effect, or edema. There is no
hydrocephalus or extra-axial fluid collection. Ventricles and sulci
are enlarged reflecting parenchymal volume loss patchy and confluent
areas of T2 hyperintensity in the supratentorial white matter are
nonspecific but may reflect chronic microvascular ischemic changes.
There are chronic small vessel infarcts of the right corona radiata
and left caudate. Small bilateral chronic cerebellar infarcts are
again identified.

Vascular: Major vessel flow voids at the skull base are preserved.

Skull and upper cervical spine: Normal marrow signal is preserved.

Sinuses/Orbits: Paranasal sinuses are aerated. Orbits are
unremarkable.

Other: Sella is unremarkable.  Mastoid air cells are clear.
IMPRESSION: No acute infarction, hemorrhage, or mass.

Parenchymal volume loss and chronic microvascular ischemic changes,
which have progressed since [DATE].
